# Patient Record
Sex: Female | Born: 1985
Health system: Southern US, Community
[De-identification: ages and names within clinical notes are randomized; demographics above are authoritative.]

## PROBLEM LIST (undated history)

## (undated) DIAGNOSIS — T8859XA Other complications of anesthesia, initial encounter: Secondary | ICD-10-CM

## (undated) DIAGNOSIS — Z8759 Personal history of other complications of pregnancy, childbirth and the puerperium: Secondary | ICD-10-CM

## (undated) DIAGNOSIS — T4145XA Adverse effect of unspecified anesthetic, initial encounter: Secondary | ICD-10-CM

---

## 2004-04-20 ENCOUNTER — Emergency Department: Payer: Self-pay | Admitting: Emergency Medicine

## 2006-01-16 ENCOUNTER — Ambulatory Visit: Payer: Self-pay | Admitting: Emergency Medicine

## 2006-01-16 ENCOUNTER — Emergency Department: Payer: Self-pay | Admitting: Emergency Medicine

## 2008-06-06 ENCOUNTER — Emergency Department: Payer: Self-pay | Admitting: Emergency Medicine

## 2008-09-04 ENCOUNTER — Emergency Department: Payer: Self-pay | Admitting: Emergency Medicine

## 2008-10-05 ENCOUNTER — Emergency Department: Payer: Self-pay | Admitting: Emergency Medicine

## 2008-12-16 ENCOUNTER — Emergency Department: Payer: Self-pay | Admitting: Internal Medicine

## 2011-01-07 ENCOUNTER — Emergency Department: Payer: Self-pay | Admitting: Emergency Medicine

## 2012-01-20 ENCOUNTER — Emergency Department: Payer: Self-pay | Admitting: Emergency Medicine

## 2012-01-20 LAB — URINALYSIS, COMPLETE
Bilirubin,UR: NEGATIVE
Glucose,UR: NEGATIVE mg/dL (ref 0–75)
Ketone: NEGATIVE
Ph: 6 (ref 4.5–8.0)
RBC,UR: 2 /HPF (ref 0–5)
Specific Gravity: 1.02 (ref 1.003–1.030)
Squamous Epithelial: 2
WBC UR: 372 /HPF (ref 0–5)

## 2012-01-20 LAB — PREGNANCY, URINE: Pregnancy Test, Urine: NEGATIVE m[IU]/mL

## 2012-02-01 ENCOUNTER — Emergency Department: Payer: Self-pay | Admitting: Emergency Medicine

## 2012-02-01 LAB — URINALYSIS, COMPLETE
Bilirubin,UR: NEGATIVE
Ketone: NEGATIVE
Leukocyte Esterase: NEGATIVE
Ph: 7 (ref 4.5–8.0)
RBC,UR: <1 /HPF (ref 0–5)
Squamous Epithelial: <1
WBC UR: <1 /HPF (ref 0–5)

## 2012-02-01 LAB — CBC
HCT: 35.6 % (ref 35.0–47.0)
MCH: 27.1 pg (ref 26.0–34.0)
MCV: 82 fL (ref 80–100)
Platelet: 340 10*3/uL (ref 150–440)
RBC: 4.37 10*6/uL (ref 3.80–5.20)
RDW: 14.5 % (ref 11.5–14.5)
WBC: 8 10*3/uL (ref 3.6–11.0)

## 2012-02-01 LAB — HCG, QUANTITATIVE, PREGNANCY: Beta Hcg, Quant.: <1 m[IU]/mL — ABNORMAL LOW

## 2012-02-01 LAB — WET PREP, GENITAL

## 2012-06-24 HISTORY — PX: CHOLECYSTECTOMY: SHX55

## 2012-11-25 LAB — COMPREHENSIVE METABOLIC PANEL
Alkaline Phosphatase: 90 U/L (ref 50–136)
Anion Gap: 9 (ref 7–16)
Chloride: 104 mmol/L (ref 98–107)
Co2: 22 mmol/L (ref 21–32)
Creatinine: 0.79 mg/dL (ref 0.60–1.30)
EGFR (African American): >60
EGFR (Non-African Amer.): >60
Potassium: 3.9 mmol/L (ref 3.5–5.1)
SGOT(AST): 17 U/L (ref 15–37)
SGPT (ALT): 16 U/L (ref 12–78)

## 2012-11-25 LAB — URINALYSIS, COMPLETE
Blood: NEGATIVE
Glucose,UR: NEGATIVE mg/dL (ref 0–75)
Leukocyte Esterase: NEGATIVE
Nitrite: NEGATIVE
Protein: NEGATIVE
RBC,UR: <1 /HPF (ref 0–5)
Squamous Epithelial: 2
WBC UR: 1 /HPF (ref 0–5)

## 2012-11-25 LAB — CBC
HGB: 12 g/dL (ref 12.0–16.0)
MCH: 25.9 pg — ABNORMAL LOW (ref 26.0–34.0)
Platelet: 322 10*3/uL (ref 150–440)
WBC: 20.7 10*3/uL — ABNORMAL HIGH (ref 3.6–11.0)

## 2012-11-26 ENCOUNTER — Inpatient Hospital Stay: Payer: Self-pay | Admitting: Surgery

## 2012-11-26 LAB — DRUG SCREEN, URINE
Amphetamines, Ur Screen: NEGATIVE (ref ?–1000)
Barbiturates, Ur Screen: NEGATIVE (ref ?–200)
Benzodiazepine, Ur Scrn: NEGATIVE (ref ?–200)
Cannabinoid 50 Ng, Ur ~~LOC~~: POSITIVE (ref ?–50)
Cocaine Metabolite,Ur ~~LOC~~: NEGATIVE (ref ?–300)
MDMA (Ecstasy)Ur Screen: NEGATIVE (ref ?–500)
Methadone, Ur Screen: NEGATIVE (ref ?–300)
Phencyclidine (PCP) Ur S: NEGATIVE (ref ?–25)
Tricyclic, Ur Screen: NEGATIVE (ref ?–1000)

## 2012-11-30 LAB — PATHOLOGY REPORT

## 2012-12-03 ENCOUNTER — Emergency Department: Payer: Self-pay | Admitting: Internal Medicine

## 2012-12-03 LAB — COMPREHENSIVE METABOLIC PANEL
Alkaline Phosphatase: 104 U/L (ref 50–136)
Anion Gap: 8 (ref 7–16)
Bilirubin,Total: 1.4 mg/dL — ABNORMAL HIGH (ref 0.2–1.0)
Chloride: 100 mmol/L (ref 98–107)
Co2: 27 mmol/L (ref 21–32)
Creatinine: 0.78 mg/dL (ref 0.60–1.30)
Glucose: 95 mg/dL (ref 65–99)
Potassium: 3.3 mmol/L — ABNORMAL LOW (ref 3.5–5.1)
Total Protein: 9.6 g/dL — ABNORMAL HIGH (ref 6.4–8.2)

## 2012-12-03 LAB — URINALYSIS, COMPLETE
Blood: NEGATIVE
Glucose,UR: NEGATIVE mg/dL (ref 0–75)
Nitrite: NEGATIVE
Ph: 6 (ref 4.5–8.0)
Protein: 100
RBC,UR: 3 /HPF (ref 0–5)
Squamous Epithelial: <1
WBC UR: 4 /HPF (ref 0–5)

## 2012-12-03 LAB — CBC
HCT: 38.1 % (ref 35.0–47.0)
HGB: 12.3 g/dL (ref 12.0–16.0)
MCHC: 32.4 g/dL (ref 32.0–36.0)
MCV: 78 fL — ABNORMAL LOW (ref 80–100)
RBC: 4.86 10*6/uL (ref 3.80–5.20)
WBC: 16.2 10*3/uL — ABNORMAL HIGH (ref 3.6–11.0)

## 2012-12-03 LAB — LIPASE, BLOOD: Lipase: 60 U/L — ABNORMAL LOW (ref 73–393)

## 2012-12-06 LAB — BETA STREP CULTURE(ARMC)

## 2013-04-22 ENCOUNTER — Encounter: Payer: Self-pay | Admitting: Obstetrics and Gynecology

## 2013-04-24 ENCOUNTER — Encounter: Payer: Self-pay | Admitting: Obstetrics and Gynecology

## 2013-05-27 ENCOUNTER — Encounter: Payer: Self-pay | Admitting: Obstetrics and Gynecology

## 2013-06-10 ENCOUNTER — Observation Stay: Payer: Self-pay | Admitting: Obstetrics and Gynecology

## 2013-06-10 ENCOUNTER — Emergency Department: Payer: Self-pay | Admitting: Emergency Medicine

## 2013-06-10 LAB — CBC WITH DIFFERENTIAL/PLATELET
Basophil #: 0 10*3/uL (ref 0.0–0.1)
Basophil %: 0.3 %
Eosinophil %: 0.1 %
HGB: 11.6 g/dL — ABNORMAL LOW (ref 12.0–16.0)
Lymphocyte %: 4.3 %
MCH: 26.9 pg (ref 26.0–34.0)
MCHC: 32 g/dL (ref 32.0–36.0)
MCV: 84 fL (ref 80–100)
Monocyte %: 8.9 %
Neutrophil %: 86.4 %
Platelet: 238 10*3/uL (ref 150–440)
WBC: 12.2 10*3/uL — ABNORMAL HIGH (ref 3.6–11.0)

## 2013-06-10 LAB — HEPATIC FUNCTION PANEL A (ARMC)
Albumin: 2.8 g/dL — ABNORMAL LOW (ref 3.4–5.0)
Alkaline Phosphatase: 90 U/L
Bilirubin, Direct: 0.2 mg/dL (ref 0.00–0.20)
Bilirubin,Total: 1 mg/dL (ref 0.2–1.0)
SGPT (ALT): 23 U/L (ref 12–78)

## 2013-06-10 LAB — BASIC METABOLIC PANEL
Anion Gap: 9 (ref 7–16)
BUN: 5 mg/dL — ABNORMAL LOW (ref 7–18)
Calcium, Total: 8.9 mg/dL (ref 8.5–10.1)
Chloride: 102 mmol/L (ref 98–107)
Co2: 21 mmol/L (ref 21–32)
Creatinine: 0.53 mg/dL — ABNORMAL LOW (ref 0.60–1.30)
Glucose: 84 mg/dL (ref 65–99)
Osmolality: 261 (ref 275–301)

## 2013-06-10 LAB — URINALYSIS, COMPLETE
Bacteria: NONE SEEN
Blood: NEGATIVE
Glucose,UR: NEGATIVE mg/dL (ref 0–75)
Nitrite: NEGATIVE
Ph: 6 (ref 4.5–8.0)
RBC,UR: 1 /HPF (ref 0–5)
Specific Gravity: 1.026 (ref 1.003–1.030)
WBC UR: <1 /HPF (ref 0–5)

## 2013-06-10 LAB — HCG, QUANTITATIVE, PREGNANCY: Beta Hcg, Quant.: 30239 m[IU]/mL — ABNORMAL HIGH

## 2013-06-10 LAB — RAPID INFLUENZA A&B ANTIGENS

## 2013-06-12 LAB — URINE CULTURE

## 2013-08-05 ENCOUNTER — Encounter: Payer: Self-pay | Admitting: Maternal & Fetal Medicine

## 2013-09-23 ENCOUNTER — Observation Stay: Payer: Self-pay

## 2013-09-23 LAB — CBC WITH DIFFERENTIAL/PLATELET
Basophil #: 0 10*3/uL (ref 0.0–0.1)
Basophil %: 0.4 %
Eosinophil #: 0 10*3/uL (ref 0.0–0.7)
Eosinophil %: 0 %
HCT: 29.7 % — ABNORMAL LOW (ref 35.0–47.0)
HGB: 9.6 g/dL — ABNORMAL LOW (ref 12.0–16.0)
Lymphocyte #: 0.5 10*3/uL — ABNORMAL LOW (ref 1.0–3.6)
Lymphocyte %: 7.2 %
MCH: 26.5 pg (ref 26.0–34.0)
MCHC: 32.5 g/dL (ref 32.0–36.0)
MCV: 81 fL (ref 80–100)
Monocyte #: 1.1 x10 3/mm — ABNORMAL HIGH (ref 0.2–0.9)
Monocyte %: 15 %
NEUTROS ABS: 5.9 10*3/uL (ref 1.4–6.5)
Neutrophil %: 77.4 %
PLATELETS: 165 10*3/uL (ref 150–440)
RBC: 3.64 10*6/uL — AB (ref 3.80–5.20)
RDW: 13.8 % (ref 11.5–14.5)
WBC: 7.6 10*3/uL (ref 3.6–11.0)

## 2013-09-23 LAB — COMPREHENSIVE METABOLIC PANEL
ALBUMIN: 2.3 g/dL — AB (ref 3.4–5.0)
ALK PHOS: 129 U/L — AB
AST: 14 U/L — AB (ref 15–37)
Anion Gap: 9 (ref 7–16)
BILIRUBIN TOTAL: 1.6 mg/dL — AB (ref 0.2–1.0)
BUN: 5 mg/dL — AB (ref 7–18)
CREATININE: 0.64 mg/dL (ref 0.60–1.30)
Calcium, Total: 8.4 mg/dL — ABNORMAL LOW (ref 8.5–10.1)
Chloride: 103 mmol/L (ref 98–107)
Co2: 22 mmol/L (ref 21–32)
EGFR (African American): >60
EGFR (Non-African Amer.): >60
GLUCOSE: 85 mg/dL (ref 65–99)
Osmolality: 265 (ref 275–301)
Potassium: 3.1 mmol/L — ABNORMAL LOW (ref 3.5–5.1)
SGPT (ALT): 12 U/L (ref 12–78)
Sodium: 134 mmol/L — ABNORMAL LOW (ref 136–145)
Total Protein: 6.4 g/dL (ref 6.4–8.2)

## 2013-09-23 LAB — DRUG SCREEN, URINE
AMPHETAMINES, UR SCREEN: NEGATIVE (ref ?–1000)
BARBITURATES, UR SCREEN: NEGATIVE (ref ?–200)
BENZODIAZEPINE, UR SCRN: NEGATIVE (ref ?–200)
CANNABINOID 50 NG, UR ~~LOC~~: POSITIVE (ref ?–50)
Cocaine Metabolite,Ur ~~LOC~~: NEGATIVE (ref ?–300)
MDMA (ECSTASY) UR SCREEN: NEGATIVE (ref ?–500)
Methadone, Ur Screen: NEGATIVE (ref ?–300)
OPIATE, UR SCREEN: NEGATIVE (ref ?–300)
PHENCYCLIDINE (PCP) UR S: NEGATIVE (ref ?–25)
Tricyclic, Ur Screen: NEGATIVE (ref ?–1000)

## 2013-09-23 LAB — URINALYSIS, COMPLETE
BILIRUBIN, UR: NEGATIVE
Blood: NEGATIVE
GLUCOSE, UR: NEGATIVE mg/dL (ref 0–75)
Hyaline Cast: 4
LEUKOCYTE ESTERASE: NEGATIVE
Nitrite: NEGATIVE
PH: 5 (ref 4.5–8.0)
RBC,UR: 2 /HPF (ref 0–5)
Specific Gravity: 1.031 (ref 1.003–1.030)

## 2013-09-23 LAB — MAGNESIUM: Magnesium: 1.6 mg/dL — ABNORMAL LOW

## 2013-09-27 ENCOUNTER — Encounter: Payer: Self-pay | Admitting: Maternal and Fetal Medicine

## 2014-10-14 NOTE — H&P (Signed)
Subjective/Chief Complaint RUQ pain and vomiting.   History of Present Illness 29 y/o female presents to the ER with a 3 day history of persistent epigastric and RUQ pain exacerbated by eating associated with several episodes of emesis.  Pain has been constant and radiates into her back,  Strong family hx of cholecystitis.  WBC elevated, RUQ US demonstrates stones in GB.  No ductal dilation.  Seen this am and is very groggy following morphine administration.   Past History THC usage G3P3   Past Med/Surgical Hx:  Pre-eclampsia:   Denies medical history:   denies:   ALLERGIES:  Shellfish: Swelling  Family and Social History:  Social History negative tobacco, negative ETOH, positive Illicit drugs   Place of Living Home   Review of Systems:  Subjective/Chief Complaint see above   Abdominal Pain Yes   Nausea/Vomiting Yes   Tolerating Diet No  Nauseated  Vomiting   Medications/Allergies Reviewed Medications/Allergies reviewed   Physical Exam:  GEN no acute distress, obese, disheveled   HEENT pale conjunctivae, PERRL, hearing intact to voice   RESP normal resp effort  clear BS   CARD regular rate   ABD positive tenderness  no liver/spleen enlargement  no hernia  soft  normal BS  no Adominal Mass   LYMPH negative neck   EXTR negative cyanosis/clubbing   SKIN normal to palpation, No rashes, No ulcers   NEURO cranial nerves intact   PSYCH A+O to time, place, person, sedated   Lab Results: Hepatic:  04-Jun-14 20:20   Bilirubin, Total  1.3  Alkaline Phosphatase 90  SGPT (ALT) 16  SGOT (AST) 17  Total Protein, Serum 7.8  Albumin, Serum 3.6  Routine Chem:  04-Jun-14 20:20   Glucose, Serum 88  BUN 7  Creatinine (comp) 0.79  Sodium, Serum  135  Potassium, Serum 3.9  Chloride, Serum 104  CO2, Serum 22  Calcium (Total), Serum 9.5  Osmolality (calc) 267  eGFR (African American) >60  eGFR (Non-African American) >60 (eGFR values <65m/min/1.73 m2 may be an  indication of chronic kidney disease (CKD). Calculated eGFR is useful in patients with stable renal function. The eGFR calculation will not be reliable in acutely ill patients when serum creatinine is changing rapidly. It is not useful in  patients on dialysis. The eGFR calculation may not be applicable to patients at the low and high extremes of body sizes, pregnant women, and vegetarians.)  Anion Gap 9  Lipase  50 (Result(s) reported on 25 Nov 2012 at 08:43PM.)  Routine UA:  04-Jun-14 20:20   Color (UA) Yellow  Clarity (UA) Clear  Glucose (UA) Negative  Bilirubin (UA) Negative  Ketones (UA) Negative  Specific Gravity (UA) 1.016  Blood (UA) Negative  pH (UA) 9.0  Protein (UA) Negative  Nitrite (UA) Negative  Leukocyte Esterase (UA) Negative (Result(s) reported on 25 Nov 2012 at 08:48PM.)  RBC (UA) <1 /HPF  WBC (UA) 1 /HPF  Bacteria (UA) NONE SEEN  Epithelial Cells (UA) 2 /HPF (Result(s) reported on 25 Nov 2012 at 08:48PM.)  Routine Hem:  04-Jun-14 20:20   WBC (CBC)  20.7  RBC (CBC) 4.64  Hemoglobin (CBC) 12.0  Hematocrit (CBC) 36.8  Platelet Count (CBC) 322 (Result(s) reported on 25 Nov 2012 at 08:37PM.)  MCV  79  MCH  25.9  MCHC 32.6  RDW 14.5    Assessment/Admission Diagnosis 29y/o female with acute calculus cholecystitis, suspect hydrops possible early ischemic changes given WBC elevation. History of THC usage.   Plan  admit, hydrate, unasyn, will discuss with Dr. Leanora Cover lap CCY. He will need to return to obtain consent when she is more coherent with exam/discussion.   Mother present and I did speak with her.   Electronic Signatures: Sherri Rad (MD)  (Signed 05-Jun-14 06:05)  Authored: CHIEF COMPLAINT and HISTORY, PAST MEDICAL/SURGIAL HISTORY, ALLERGIES, FAMILY AND SOCIAL HISTORY, REVIEW OF SYSTEMS, PHYSICAL EXAM, LABS, ASSESSMENT AND PLAN   Last Updated: 05-Jun-14 06:05 by Sherri Rad (MD)

## 2014-10-14 NOTE — Op Note (Signed)
PATIENT NAME:  Theresa Alvarez, Theresa Alvarez MR#:  [id number] DATE OF BIRTH:  22-Nov-1985  DATE OF PROCEDURE:  11/26/2012  PREOPERATIVE DIAGNOSIS: Acute cholecystitis.   POSTOPERATIVE DIAGNOSIS: Cholelithiasis.   PROCEDURE PERFORMED: Laparoscopic cholecystectomy.   SURGEON: Raynald KempMark A Jaymond Waage, MD ASSISTANT: Surgical scrub technologist.   TYPE OF ANESTHESIA: General endotracheal, [facility].   FINDINGS: Distended gallbladder.   SPECIMENS: Gallbladder with contents to pathology.   ESTIMATED BLOOD LOSS: 25 mL   DRAINS: None.   LAP AND NEEDLE COUNT: Correct x 2.   DESCRIPTION OF PROCEDURE: With the patient in the supine position and general endotracheal anesthesia, left arm was padded and tucked at her side. The abdomen was widely prepped and draped utilizing ChloraPrep solution. Timeout was observed. A 12 mm blunt Hassan trocar was placed through an infraumbilical transverse oriented skin incision with stay sutures being passed through the fascia. Pneumoperitoneum was established. A 5 mm Bladeless trocar was placed in the epigastrium, two 5-mm ports in the right subcostal margin. The gallbladder was grasped along the fundus and elevated towards the right shoulder superiorly and laterally. Lateral traction was applied on Hartman pouch. The hepatoduodenal ligament was then incised utilizing blunt technique, liberating a cystic duct and cystic artery with a critical view of safety cholecystectomy being performed. The cystic duct was triply clipped on the portal side, singly clipped on the gallbladder side and divided. Cystic artery was doubly clipped on the portal side, singly clipped on the gallbladder side and divided. Further dissection along the gallbladder fossa demonstrated no evidence of aberrant vessel or bile duct. The gallbladder was then retrieved off the gallbladder fossa utilizing hook cautery apparatus, captured in an Endo Catch device with spillage of a small amount of thick bile,  which was immediately aspirated and irrigated with a liter of normal saline. A 5 mm camera was used in the epigastric port to visualize extraction. The umbilical port demonstrated no evidence of bowel injury. Pneumoperitoneum was then re-established, the right upper quadrant being inspected for hemostasis. Ports were then removed under direct visualization. The infraumbilical fascial defect being reapproximated with a figure-of-eight #0 Vicryl suture in vertical orientation. A total of 30 mL of 0.25% plain Marcaine was infiltrated along all skin and fascial incisions prior to closure. A combination of 4-0 Vicryl subcuticular and 4-0 nylon simple and vertical mattress used to reapproximate skin edges followed by application of Steri-Strips, Telfa, and Tegaderm. The patient was then subsequently extubated and taken to the recovery room in stable and satisfactory condition by anesthesia services.     ____________________________ Redge GainerMark A. Egbert GaribaldiBird, MD FACS mab:cc D: 11/26/2012 20:55:49 ET T: 11/26/2012 23:33:49 ET JOB#: [id number]  cc: Loraine LericheMark A. Egbert GaribaldiBird, MD, <Dictator> Raynald KempMARK A Maksymilian Mabey MD ELECTRONICALLY SIGNED 11/27/2012 16:59

## 2014-11-01 NOTE — H&P (Signed)
L&D Evaluation:  History Expanded:  HPI 29yo At 4920w6days who presensts with severe n/v and who has been vomiting since this am with the nausea nd the mucus with the headache that has been horribkle for the two days. she is coughing and she has ahd strep in May but did not finish her meds then as she can not take pills, she is warm to the touch and is sweaty and she has a 102.5 fever,she has taken nothing and nithing makes it better she is tired and weak.her son was sick this week with a viral infection and he was seen and strep was neg   Gravida 4   Term 3   PreTerm 0   Abortion 0   Living 3   Blood Type (Maternal) O positive   Group B Strep Results Maternal (Result >5wks must be treated as unknown) unknown/result > 5 weeks ago    Maternal HIV Unknown   Maternal Syphilis Ab Unknown   Maternal Varicella Unknown   Rubella Results (Maternal) unknown   Maternal T-Dap Unknown   [facility]   Presents with nausea/vomiting   Patient's Medical History No Chronic Illness    Patient's Surgical History Colecystectomy    Medications Pre Natal Vitamins    Allergies NKDA   Social History none    Family History Non-Contributory    ROS:  General very weak   HEENT sore throat and cough and   CNS normal   Resp cough   CV normal   Renal normal   MS normal   Exam:  Vital Signs stable  TM 102.5    Urine Protein 1+   General no apparent distress   Mental Status clear    Chest clear    Heart normal sinus rhythm   Abdomen gravid, non-tender   Estimated Fetal Weight Small for gestational age   Fundal Height umb   Back no CVAT   Edema no edema    Reflexes 1+    Clonus positive   Pelvic no external lesions, cervix closed and thick   Mebranes Intact   FHT normal rate with no decels   Ucx absent   Skin dry   Lymph no lymphadenopathy    Impression:  Impression viral vs strep throat and flu   Plan:  Plan UA   Comments monitor throat  and get rapid strep and rapid flu will treast with metrogel and follow up next week so she cna have some relief this weekend and get totER to be tested for others   Follow Up Appointment need to schedule. MONDAY   Electronic Signatures: Adria DevonKlett, Joshia Kitchings (MD)  (Signed 18-Dec-14 21:16)  Authored: L&D Evaluation   Last Updated: 18-Dec-14 21:16 by Adria DevonKlett, Nakota Elsen (MD)

## 2014-11-01 NOTE — H&P (Signed)
L&D Evaluation:  History Expanded:  HPI 29yo [id number] at 8958w6days by LMP consistent with 13 week ultrasound.  Pregnancy complicated by a history of preeclampsia with G1. She presents with multiple vague comlaints for the past two days.  She states that she received a TDaP [facility].  Sinice then she has had pain "all over."  She states that she has three children and "can't get up to feed them."  She notes a headache mainly in the frontal region of her head. She has had no fevers, chills, or visual disturbances.  The headache started this morning after she had an episode of emesis.  She denies trouble breathing, chest pain, abdominal pain (the only place she does not have pain).  She has no nausea. However, she states that she has been unable to hold down anything (liquid or solid) for the past two days. She has had a cough which is occasional productive for mucus.  She has had recent diarrhea going 3 - 4 times in the past few days.  She denies urinary and vaginal symptoms.  She notes a clear vaginal discharge for the past week.  She notes positive fetal movement, no gush of fluid, no vaginal bleeding, and no contractions.   Gravida 4   Term 3   PreTerm 0   Abortion 0   Living 3   Blood Type (Maternal) O positive   Group B Strep Results Maternal (Result >5wks must be treated as unknown) unknown/result > 5 weeks ago   Maternal HIV Unknown   Maternal Syphilis Ab Unknown   Maternal Varicella Unknown   Rubella Results (Maternal) unknown   Maternal T-Dap Unknown   [facility]   Patient's Medical History No Chronic Illness   Patient's Surgical History Colecystectomy   Medications Pre Natal Vitamins  Iron   Allergies NKDA   Social History drugs  marijuana (most recent one month ago)   Family History Non-Contributory   ROS:  General very weak   HEENT headache   CNS normal   GI diarrhea   Resp cough   CV normal   Renal normal   MS  normal   Exam:  Vital Signs stable  T98.9, P110, BP 99/53, O2 sats 98%RA   Urine Protein 1+   General no apparent distress, appears tired   Mental Status clear   Chest clear   Heart normal sinus rhythm, tachycardia   Abdomen gravid, non-tender   Estimated Fetal Weight Small for gestational age   Back no CVAT   Edema no edema   Clonus negative   Mebranes Intact   FHT normal rate with no decels   FHT Description 150/mod var/+accles/no decels   Ucx irregular, infrequent   Skin dry   Lymph no lymphadenopathy   Impression:  Impression 1) Intrauterine pregnancy at 4058w6d gestational age, 2) inability to keep food down, 3) generalized pain and headache   Plan:  Comments - UA and urine drug screen - IV fluid rescusitation (start with 1 liter NS) - CBC w/diff, CMP - tylenol for headache - no evidence of preeclampsia - Fetal well being reassuring with cat 1 tracing.  Continue to watch   Electronic Signatures: Conard NovakJackson, Meredyth Hornung D (MD)  (Signed 02-Apr-15 18:46)  Authored: L&D Evaluation   Last Updated: 02-Apr-15 18:46 by Conard NovakJackson, Milayah Krell D (MD)

## 2014-12-28 ENCOUNTER — Emergency Department
Admission: EM | Admit: 2014-12-28 | Discharge: 2014-12-28 | Disposition: A | Discharge status: Home / Self Care | Payer: Medicaid Other | Attending: Emergency Medicine | Admitting: Emergency Medicine

## 2014-12-28 ENCOUNTER — Encounter: Payer: Self-pay | Admitting: Emergency Medicine

## 2014-12-28 DIAGNOSIS — M62838 Other muscle spasm: Secondary | ICD-10-CM | POA: Diagnosis not present

## 2014-12-28 DIAGNOSIS — N898 Other specified noninflammatory disorders of vagina: Secondary | ICD-10-CM | POA: Insufficient documentation

## 2014-12-28 DIAGNOSIS — M62831 Muscle spasm of calf: Secondary | ICD-10-CM | POA: Diagnosis not present

## 2014-12-28 DIAGNOSIS — M79604 Pain in right leg: Secondary | ICD-10-CM | POA: Diagnosis present

## 2014-12-28 DIAGNOSIS — R300 Dysuria: Secondary | ICD-10-CM | POA: Insufficient documentation

## 2014-12-28 DIAGNOSIS — Z3202 Encounter for pregnancy test, result negative: Secondary | ICD-10-CM | POA: Diagnosis not present

## 2014-12-28 LAB — URINALYSIS COMPLETE WITH MICROSCOPIC (ARMC ONLY)
Bilirubin Urine: NEGATIVE
GLUCOSE, UA: NEGATIVE mg/dL
Ketones, ur: NEGATIVE mg/dL
Nitrite: NEGATIVE
Protein, ur: NEGATIVE mg/dL
Specific Gravity, Urine: 1.02 (ref 1.005–1.030)
pH: 6 (ref 5.0–8.0)

## 2014-12-28 LAB — CBC WITH DIFFERENTIAL/PLATELET
BASOS ABS: 0.1 10*3/uL (ref 0–0.1)
Basophils Relative: 1 %
Eosinophils Absolute: 0.1 10*3/uL (ref 0–0.7)
Eosinophils Relative: 1 %
HCT: 36.7 % (ref 35.0–47.0)
Hemoglobin: 11.9 g/dL — ABNORMAL LOW (ref 12.0–16.0)
Lymphocytes Relative: 29 %
Lymphs Abs: 2.8 10*3/uL (ref 1.0–3.6)
MCH: 27.2 pg (ref 26.0–34.0)
MCHC: 32.5 g/dL (ref 32.0–36.0)
MCV: 83.7 fL (ref 80.0–100.0)
Monocytes Absolute: 0.6 10*3/uL (ref 0.2–0.9)
Monocytes Relative: 6 %
NEUTROS ABS: 6.2 10*3/uL (ref 1.4–6.5)
Neutrophils Relative %: 63 %
PLATELETS: 276 10*3/uL (ref 150–440)
RBC: 4.38 MIL/uL (ref 3.80–5.20)
RDW: 13.5 % (ref 11.5–14.5)
WBC: 9.9 10*3/uL (ref 3.6–11.0)

## 2014-12-28 LAB — BASIC METABOLIC PANEL
Anion gap: 6 (ref 5–15)
BUN: 8 mg/dL (ref 6–20)
CHLORIDE: 106 mmol/L (ref 101–111)
CO2: 26 mmol/L (ref 22–32)
Calcium: 8.6 mg/dL — ABNORMAL LOW (ref 8.9–10.3)
Creatinine, Ser: 0.69 mg/dL (ref 0.44–1.00)
GFR calc non Af Amer: >60 mL/min (ref >60)
Glucose, Bld: 99 mg/dL (ref 65–99)
Potassium: 3.6 mmol/L (ref 3.5–5.1)
SODIUM: 138 mmol/L (ref 135–145)

## 2014-12-28 LAB — POCT PREGNANCY, URINE: Preg Test, Ur: NEGATIVE

## 2014-12-28 MED ORDER — BACITRACIN ZINC 500 UNIT/GM EX OINT
TOPICAL_OINTMENT | CUTANEOUS | Status: AC
  Filled 2014-12-28: qty 0.9

## 2014-12-28 NOTE — ED Notes (Signed)
States she has had intermittent jerking to body ..jerking episodes last for a short while. The jerking may be in different parts of body ..denies any fever,n/v/d or any health problems.. No jerking noted on arrival to ed

## 2014-12-28 NOTE — ED Provider Notes (Signed)
[facility] Provider Note  ____________________________________________  Time seen: Approximately 8:25 AM  I have reviewed the triage vital signs and the nursing notes.   HISTORY  Chief Complaint No chief complaint on file.    HPI Theresa Alvarez is a 29 y.o. female patient complaining of intermittent jerking to the body of the upper and lower extremities. Patient stated there is no pattern to this and it occurs sporadically. Patient's concern because she stated what she was driving her right arm jerked and she momentarily lost control of the car. Patient had a previous episode 2 days prior to her left leg jerked while she was at work. Patient is also complaining of a laceration to the labia majora secondary to a shaving incident 3 days ago. States pain when urinating.    History reviewed. No pertinent past medical history.  There are no active problems to display for this patient.   Past Surgical History  Procedure Laterality Date  . Cholecystectomy      No current outpatient prescriptions on file.  Allergies Review of patient's allergies indicates no known allergies.  No family history on file.  Social History History  Substance Use Topics  . Smoking status: Never Smoker   . Smokeless tobacco: Not on file  . Alcohol Use: No    Review of Systems Constitutional: No fever/chills Eyes: No visual changes. ENT: No sore throat. Cardiovascular: Denies chest pain. Respiratory: Denies shortness of breath. Gastrointestinal: No abdominal pain.  No nausea, no vomiting.  No diarrhea.  No constipation. Genitourinary: Positive for dysuria. Musculoskeletal: Negative for back pain. Skin: Negative for rash. Vaginal labia irritation Neurological: Negative for headaches, focal weakness or numbness. States sporadic extremity jerking.  10-point ROS otherwise negative.  ____________________________________________   PHYSICAL  EXAM:  VITAL SIGNS: ED Triage Vitals  Enc Vitals Group     BP 12/28/14 0822 140/82 mmHg     Pulse Rate 12/28/14 0822 95     Resp 12/28/14 0822 20     Temp 12/28/14 0822 97.9 F (36.6 C)     Temp Source 12/28/14 0822 Oral     SpO2 12/28/14 0822 99 %     Weight 12/28/14 0822 158 lb (71.668 kg)     Height 12/28/14 0822 5\' 1"  (1.549 m)     Head Cir --      Peak Flow --      Pain Score --      Pain Loc --      Pain Edu? --      Excl. in GC? --     Constitutional: Alert and oriented. Well appearing and in no acute distress. Eyes: Conjunctivae are normal. PERRL. EOMI. Head: Atraumatic. Nose: No congestion/rhinnorhea. Mouth/Throat: Mucous membranes are moist.  Oropharynx non-erythematous. Neck: No stridor. No cervical spine tenderness to palpation. Hematological/Lymphatic/Immunilogical: No cervical lymphadenopathy. Cardiovascular: Normal rate, regular rhythm. Grossly normal heart sounds.  Good peripheral circulation. Respiratory: Normal respiratory effort.  No retractions. Lungs CTAB. Gastrointestinal: Soft and nontender. No distention. No abdominal bruits. No CVA tenderness. Genitourinary:  Musculoskeletal: No lower extremity tenderness nor edema.  No joint effusions. Neurologic:  Normal speech and language. No gross focal neurologic deficits are appreciated. Speech is normal. No gait instability. Skin:  Skin is warm, dry and intact. No rash noted. Mild erythema secondary to shaving in the vaginal area. Psychiatric: Mood and affect are normal. Speech and behavior are normal.  ____________________________________________   LABS (all labs ordered are listed, but only abnormal results  are displayed)  Labs Reviewed  CBC WITH DIFFERENTIAL/PLATELET - Abnormal; Notable for the following:    Hemoglobin 11.9 (*)    All other components within normal limits  BASIC METABOLIC PANEL - Abnormal; Notable for the following:    Calcium 8.6 (*)    All other components within normal limits   URINALYSIS COMPLETEWITH MICROSCOPIC ([facility] ONLY) - Abnormal; Notable for the following:    Color, Urine YELLOW (*)    APPearance HAZY (*)    Hgb urine dipstick 1+ (*)    Leukocytes, UA 1+ (*)    Bacteria, UA RARE (*)    Squamous Epithelial / LPF 6-30 (*)    All other components within normal limits  POC URINE PREG, ED  POCT PREGNANCY, URINE   ____________________________________________  EKG   ____________________________________________  RADIOLOGY   ____________________________________________   PROCEDURES  Procedure(s) performed: None  Critical Care performed: No  ____________________________________________   INITIAL IMPRESSION / ASSESSMENT AND PLAN / ED COURSE  Pertinent labs & imaging results that were available during my care of the patient were reviewed by me and considered in my medical decision making (see chart for details).  Intermittent muscle spasms etiology unknown. Patient advised follow-up for neurology. Patient advised to determine if condition worsens. ____________________________________________   FINAL CLINICAL IMPRESSION(S) / ED DIAGNOSES  Final diagnoses:  Muscle spasm      Joni Reining, PA-C 12/28/14 [id number]  Sharman Cheek, MD 12/28/14 1520

## 2014-12-28 NOTE — ED Notes (Signed)
Pt reports  History of tendontitis. States her arms "jerk" at random.

## 2015-01-24 ENCOUNTER — Encounter: Payer: Self-pay | Admitting: *Deleted

## 2015-01-25 ENCOUNTER — Ambulatory Visit: Payer: Medicaid Other | Admitting: Diagnostic Neuroimaging

## 2015-01-26 ENCOUNTER — Encounter: Payer: Self-pay | Admitting: Diagnostic Neuroimaging

## 2015-03-17 ENCOUNTER — Emergency Department: Payer: Medicaid Other

## 2015-03-17 ENCOUNTER — Encounter: Payer: Self-pay | Admitting: Emergency Medicine

## 2015-03-17 ENCOUNTER — Emergency Department
Admission: EM | Admit: 2015-03-17 | Discharge: 2015-03-17 | Disposition: A | Discharge status: Home / Self Care | Payer: Medicaid Other | Attending: Emergency Medicine | Admitting: Emergency Medicine

## 2015-03-17 DIAGNOSIS — Z3A08 8 weeks gestation of pregnancy: Secondary | ICD-10-CM | POA: Diagnosis not present

## 2015-03-17 DIAGNOSIS — O200 Threatened abortion: Secondary | ICD-10-CM | POA: Diagnosis not present

## 2015-03-17 DIAGNOSIS — O209 Hemorrhage in early pregnancy, unspecified: Secondary | ICD-10-CM | POA: Diagnosis present

## 2015-03-17 LAB — CBC
HCT: 33.9 % — ABNORMAL LOW (ref 35.0–47.0)
Hemoglobin: 11.1 g/dL — ABNORMAL LOW (ref 12.0–16.0)
MCH: 27.3 pg (ref 26.0–34.0)
MCHC: 32.7 g/dL (ref 32.0–36.0)
MCV: 83.5 fL (ref 80.0–100.0)
Platelets: 229 10*3/uL (ref 150–440)
RBC: 4.06 MIL/uL (ref 3.80–5.20)
RDW: 14.7 % — ABNORMAL HIGH (ref 11.5–14.5)
WBC: 8.7 10*3/uL (ref 3.6–11.0)

## 2015-03-17 LAB — BASIC METABOLIC PANEL
Anion gap: 4 — ABNORMAL LOW (ref 5–15)
BUN: 11 mg/dL (ref 6–20)
CHLORIDE: 109 mmol/L (ref 101–111)
CO2: 24 mmol/L (ref 22–32)
CREATININE: 0.57 mg/dL (ref 0.44–1.00)
Calcium: 8.5 mg/dL — ABNORMAL LOW (ref 8.9–10.3)
GFR calc non Af Amer: >60 mL/min (ref >60)
Glucose, Bld: 88 mg/dL (ref 65–99)
Potassium: 3.4 mmol/L — ABNORMAL LOW (ref 3.5–5.1)
Sodium: 137 mmol/L (ref 135–145)

## 2015-03-17 LAB — HCG, QUANTITATIVE, PREGNANCY: hCG, Beta Chain, Quant, S: 6894 m[IU]/mL — ABNORMAL HIGH (ref <5)

## 2015-03-17 LAB — ABO/RH: ABO/RH(D): O POS

## 2015-03-17 NOTE — ED Notes (Signed)
Reports [redacted] wks pregnant, states she woke up this am with bleeding and cramping.

## 2015-03-17 NOTE — Discharge Instructions (Signed)
Please follow up with [facility] this week for repeat beta hCG testing. Your beta hCG level today is 6000. Return to the emergency department for any abdominal pain, significantly increased bleeding, or any other symptom personally concerning to yourself.   Threatened Miscarriage A threatened miscarriage occurs when you have vaginal bleeding during your first 20 weeks of pregnancy but the pregnancy has not ended. If you have vaginal bleeding during this time, your health care provider will do tests to make sure you are still pregnant. If the tests show you are still pregnant and the developing baby (fetus) inside your womb (uterus) is still growing, your condition is considered a threatened miscarriage. A threatened miscarriage does not mean your pregnancy will end, but it does increase the risk of losing your pregnancy (complete miscarriage). CAUSES  The cause of a threatened miscarriage is usually not known. If you go on to have a complete miscarriage, the most common cause is an abnormal number of chromosomes in the developing baby. Chromosomes are the structures inside cells that hold all your genetic material. Some causes of vaginal bleeding that do not result in miscarriage include:  Having sex.  Having an infection.  Normal hormone changes of pregnancy.  Bleeding that occurs when an egg implants in your uterus. RISK FACTORS Risk factors for bleeding in early pregnancy include:  Obesity.  Smoking.  Drinking excessive amounts of alcohol or caffeine.  Recreational drug use. SIGNS AND SYMPTOMS  Light vaginal bleeding.  Mild abdominal pain or cramps. DIAGNOSIS  If you have bleeding with or without abdominal pain before 20 weeks of pregnancy, your health care provider will do tests to check whether you are still pregnant. One important test involves using sound waves and a computer (ultrasound) to create images of the inside of your uterus. Other tests include an internal  exam of your vagina and uterus (pelvic exam) and measurement of your baby's heart rate.  You may be diagnosed with a threatened miscarriage if:  Ultrasound testing shows you are still pregnant.  Your baby's heart rate is strong.  A pelvic exam shows that the opening between your uterus and your vagina (cervix) is closed.  Your heart rate and blood pressure are stable.  Blood tests confirm you are still pregnant. TREATMENT  No treatments have been shown to prevent a threatened miscarriage from going on to a complete miscarriage. However, the right home care is important.  HOME CARE INSTRUCTIONS   Make sure you keep all your appointments for prenatal care. This is very important.  Get plenty of rest.  Do not have sex or use tampons if you have vaginal bleeding.  Do not douche.  Do not smoke or use recreational drugs.  Do not drink alcohol.  Avoid caffeine. SEEK MEDICAL CARE IF:  You have light vaginal bleeding or spotting while pregnant.  You have abdominal pain or cramping.  You have a fever. SEEK IMMEDIATE MEDICAL CARE IF:  You have heavy vaginal bleeding.  You have blood clots coming from your vagina.  You have severe low back pain or abdominal cramps.  You have fever, chills, and severe abdominal pain. MAKE SURE YOU:  Understand these instructions.  Will watch your condition.  Will get help right away if you are not doing well or get worse. Document Released: 06/10/2005 Document Revised: 06/15/2013 Document Reviewed: 04/06/2013 [facility] Patient Information 2015 [place], [place]. This information is not intended to replace advice given to you by your health care provider. Make sure you  discuss any questions you have with your health care provider. ° °

## 2015-03-17 NOTE — ED Provider Notes (Signed)
[facility] Emergency Department Provider Note  Time seen: 7:22 AM  I have reviewed the triage vital signs and the nursing notes.   HISTORY  Chief Complaint Vaginal Bleeding    HPI Theresa Alvarez is a 29 y.o. female G5 P4 with no past medical history presents the emergency department with vaginal bleeding and cramping. According to the patient she is approximately [redacted] weeks pregnant by last menstrual period, and awoke this morning to mild to moderate vaginal bleeding with lower abdominal cramping. Denies any tissue passage. States the cramping is mild currently. No other medical complaints currently.     History reviewed. No pertinent past medical history.  There are no active problems to display for this patient.   Past Surgical History  Procedure Laterality Date  . Cholecystectomy  2014    lap    No current outpatient prescriptions on file.  Allergies Review of patient's allergies indicates no known allergies.  Family History  Problem Relation Age of Onset  . Diabetes Father   . Diabetes Maternal Grandmother   . Diabetes Paternal Grandmother     Social History Social History  Substance Use Topics  . Smoking status: Never Smoker   . Smokeless tobacco: None  . Alcohol Use: No    Review of Systems Constitutional: Negative for fever. Cardiovascular: Negative for chest pain. Respiratory: Negative for shortness of breath. Gastrointestinal: Mild lower abdominal cramping Genitourinary: Negative for dysuria. Neurological: Negative for headache 10-point ROS otherwise negative.  ____________________________________________   PHYSICAL EXAM:  VITAL SIGNS: ED Triage Vitals  Enc Vitals Group     BP 03/17/15 0707 119/72 mmHg     Pulse Rate 03/17/15 0707 91     Resp 03/17/15 0707 18     Temp 03/17/15 0707 98.1 F (36.7 C)     Temp Source 03/17/15 0707 Oral     SpO2 03/17/15 0707 10 %     Weight 03/17/15 0707 162 lb (73.483 kg)   Height 03/17/15 0707  (1.549 m)     Head Cir --      Peak Flow --      Pain Score 03/17/15 0708 2     Pain Loc --      Pain Edu? --      Excl. in GC? --     Constitutional: Alert and oriented. Well appearing and in no distress. Eyes: Normal exam ENT   Mouth/Throat: Mucous membranes are moist. Cardiovascular: Normal rate, regular rhythm. Respiratory: Normal respiratory effort without tachypnea nor retractions. Breath sounds are clear and equal bilaterally. No wheezes/rales/rhonchi. Gastrointestinal: Soft and nontender. No distention.  There is no CVA tenderness. Musculoskeletal: Nontender with normal range of motion in all extremities.  Neurologic:  Normal speech and language. No gross focal neurologic deficits are appreciated. Speech is normal. Skin:  Skin is warm, dry and intact.  Psychiatric: Mood and affect are normal. Speech and behavior are normal.  ____________________________________________     RADIOLOGY  Ultrasound consistent with 5 week 5 day fetus, heart rate 93 bpm seen.  ____________________________________________   INITIAL IMPRESSION / ASSESSMENT AND PLAN / ED COURSE  Pertinent labs & imaging results that were available during my care of the patient were reviewed by me and considered in my medical decision making (see chart for details).  Patient approximately [redacted] weeks pregnant by last pressure., With vaginal bleeding and abdominal cramping. We will check labs including beta hCG, and obtain an ultrasound to help further evaluate. No distress currently. Overall the  patient appears very well.  Labs within normal limit, positive blood type. Ultrasound shows 5 week 5 day pregnancy, with heartbeat seen. I discussed the findings with the patient, she believes she could be 5 weeks instead of 8 weeks. I discussed with the patient follow up with Phineas Real for repeat beta hCG testing. Patient agreeable. Discussed my normal miscarriage return precautions, patient  agreeable.  ____________________________________________   FINAL CLINICAL IMPRESSION(S) / ED DIAGNOSES  Threatened abortion   Minna Antis, MD 03/17/15 1042

## 2015-03-17 NOTE — ED Notes (Signed)
Pt presents with vaginal bleeding starting around 6am today. Pt is [redacted] weeks pregnant and states this is her fifth pregnancy. Pt states she currently has a tampon in place and has not had to change it out. Pt also c/o mild cramping. No acute distress noted at this time.

## 2015-03-17 NOTE — ED Notes (Signed)
Pt left without discharge instructions. edp did go back in and explain discharge instructions to pt.  Pt left without signing.

## 2015-03-20 ENCOUNTER — Other Ambulatory Visit: Payer: Self-pay | Admitting: Primary Care

## 2015-03-20 DIAGNOSIS — Z3490 Encounter for supervision of normal pregnancy, unspecified, unspecified trimester: Secondary | ICD-10-CM

## 2015-03-23 ENCOUNTER — Ambulatory Visit: Payer: Medicaid Other

## 2015-04-28 ENCOUNTER — Emergency Department
Admission: EM | Admit: 2015-04-28 | Discharge: 2015-04-28 | Disposition: A | Discharge status: Home / Self Care | Payer: Medicaid Other | Attending: Emergency Medicine | Admitting: Emergency Medicine

## 2015-04-28 ENCOUNTER — Encounter: Payer: Self-pay | Admitting: *Deleted

## 2015-04-28 DIAGNOSIS — Z3A12 12 weeks gestation of pregnancy: Secondary | ICD-10-CM | POA: Diagnosis not present

## 2015-04-28 DIAGNOSIS — O210 Mild hyperemesis gravidarum: Secondary | ICD-10-CM

## 2015-04-28 LAB — COMPREHENSIVE METABOLIC PANEL
ALT: 11 U/L — ABNORMAL LOW (ref 14–54)
AST: 16 U/L (ref 15–41)
Albumin: 3.6 g/dL (ref 3.5–5.0)
Alkaline Phosphatase: 60 U/L (ref 38–126)
Anion gap: 5 (ref 5–15)
BUN: <5 mg/dL — ABNORMAL LOW (ref 6–20)
CO2: 24 mmol/L (ref 22–32)
Calcium: 8.9 mg/dL (ref 8.9–10.3)
Chloride: 105 mmol/L (ref 101–111)
Creatinine, Ser: 0.49 mg/dL (ref 0.44–1.00)
Glucose, Bld: 83 mg/dL (ref 65–99)
Potassium: 3.4 mmol/L — ABNORMAL LOW (ref 3.5–5.1)
SODIUM: 134 mmol/L — AB (ref 135–145)
Total Bilirubin: 1.5 mg/dL — ABNORMAL HIGH (ref 0.3–1.2)
Total Protein: 7.1 g/dL (ref 6.5–8.1)

## 2015-04-28 LAB — LIPASE, BLOOD: LIPASE: 15 U/L (ref 11–51)

## 2015-04-28 LAB — CBC
HCT: 35.8 % (ref 35.0–47.0)
Hemoglobin: 11.8 g/dL — ABNORMAL LOW (ref 12.0–16.0)
MCH: 28 pg (ref 26.0–34.0)
MCHC: 32.9 g/dL (ref 32.0–36.0)
MCV: 85.2 fL (ref 80.0–100.0)
Platelets: 235 10*3/uL (ref 150–440)
RBC: 4.2 MIL/uL (ref 3.80–5.20)
RDW: 13.9 % (ref 11.5–14.5)
WBC: 16 10*3/uL — AB (ref 3.6–11.0)

## 2015-04-28 LAB — URINALYSIS COMPLETE WITH MICROSCOPIC (ARMC ONLY)
BILIRUBIN URINE: NEGATIVE
Bacteria, UA: NONE SEEN
GLUCOSE, UA: NEGATIVE mg/dL
Hgb urine dipstick: NEGATIVE
Ketones, ur: NEGATIVE mg/dL
Leukocytes, UA: NEGATIVE
NITRITE: NEGATIVE
Protein, ur: NEGATIVE mg/dL
SPECIFIC GRAVITY, URINE: 1.002 — AB (ref 1.005–1.030)
pH: 8 (ref 5.0–8.0)

## 2015-04-28 MED ORDER — SODIUM CHLORIDE 0.9 % IV BOLUS (SEPSIS)
2000.0000 mL | Freq: Once | INTRAVENOUS | Status: AC
  Administered 2015-04-28: 2000 mL via INTRAVENOUS

## 2015-04-28 MED ORDER — ONDANSETRON HCL 4 MG/2ML IJ SOLN
INTRAMUSCULAR | Status: AC
  Administered 2015-04-28: 4 mg
  Filled 2015-04-28: qty 2

## 2015-04-28 NOTE — ED Notes (Signed)
Attempted to hear fetal heart tones but did not have success. Pt reports heart tones were heard two days ago at [facility].

## 2015-04-28 NOTE — ED Notes (Signed)
Pt arrived to ED reporting nausea and vomiting at least three times a day for the past 5 weeks. Pt reports recently beginning to feel weak and "dehyrated." pt reports this is her fifth pregnancy and pt reports this is not normal and feels as though "something is off"

## 2015-04-28 NOTE — ED Notes (Addendum)
Dr. Cyril LoosenKinner at bedside with ultrasound.  Per Dr. Cyril LoosenKinner, pt 12.[redacted] wks pregnant

## 2015-04-28 NOTE — ED Provider Notes (Signed)
[facility] Emergency Department Provider Note  ____________________________________________  Time seen: 5:30 PM  I have reviewed the triage vital signs and the nursing notes.   HISTORY  Chief Complaint Nausea and Emesis    HPI Amiayah L Pankonin is a 29 y.o. female who presents with complaints of nausea and vomiting essentially daily for the past 5 weeks. She reports she is [redacted] weeks pregnant and she started to feel dehydrated. She did not have this issue during her first pregnancies. She denies abdominal pain. No bleeding. No fevers no chills. No dysuria. No frequency. Her OB gave her Zofran which is not helping particularly well     History reviewed. No pertinent past medical history.  There are no active problems to display for this patient.   Past Surgical History  Procedure Laterality Date  . Cholecystectomy  2014    lap    No current outpatient prescriptions on file.  Allergies Shrimp  Family History  Problem Relation Age of Onset  . Diabetes Father   . Diabetes Maternal Grandmother   . Diabetes Paternal Grandmother     Social History Social History  Substance Use Topics  . Smoking status: Never Smoker   . Smokeless tobacco: None  . Alcohol Use: No    Review of Systems  Constitutional: Negative for fever. Eyes: Negative for visual changes. ENT: Negative for sore throat Cardiovascular: Negative for chest pain. Respiratory: Negative for shortness of breath. Gastrointestinal: Negative for abdominal pain. Positive for vomiting Genitourinary: Negative for dysuria. Musculoskeletal: Negative for back pain. Skin: Negative for rash. Neurological: Negative for headaches or focal weakness Psychiatric: No anxiety    ____________________________________________   PHYSICAL EXAM:  VITAL SIGNS: ED Triage Vitals  Enc Vitals Group     BP 04/28/15 1434 120/74 mmHg     Pulse Rate 04/28/15 1434 104     Resp 04/28/15 1434 18   Temp 04/28/15 1434 98.4 F (36.9 C)     Temp Source 04/28/15 1434 Oral     SpO2 04/28/15 1434 99 %     Weight 04/28/15 1434 153 lb (69.4 kg)     Height 04/28/15 1434  (1.549 m)     Head Cir --      Peak Flow --      Pain Score 04/28/15 1431 7     Pain Loc --      Pain Edu? --      Excl. in GC? --     Constitutional: Alert and oriented. Well appearing and in no distress. Eyes: Conjunctivae are normal.  ENT   Head: Normocephalic and atraumatic.   Mouth/Throat: Mucous membranes are moist. Cardiovascular: Normal rate, regular rhythm. Normal and symmetric distal pulses are present in all extremities. No murmurs, rubs, or gallops. Respiratory: Normal respiratory effort without tachypnea nor retractions. Breath sounds are clear and equal bilaterally.  Gastrointestinal: Soft and non-tender in all quadrants. No distention. There is no CVA tenderness. Genitourinary: deferred Musculoskeletal: Nontender with normal range of motion in all extremities. No lower extremity tenderness nor edema. Neurologic:  Normal speech and language. No gross focal neurologic deficits are appreciated. Skin:  Skin is warm, dry and intact. No rash noted. Psychiatric: Mood and affect are normal. Patient exhibits appropriate insight and judgment.  ____________________________________________    LABS (pertinent positives/negatives)  Labs Reviewed  CBC - Abnormal; Notable for the following:    WBC 16.0 (*)    Hemoglobin 11.8 (*)    All other components within normal limits  COMPREHENSIVE METABOLIC PANEL - Abnormal; Notable for the following:    Sodium 134 (*)    Potassium 3.4 (*)    BUN <5 (*)    ALT 11 (*)    Total Bilirubin 1.5 (*)    All other components within normal limits  LIPASE, BLOOD  URINALYSIS COMPLETEWITH MICROSCOPIC ([facility] ONLY)    ____________________________________________   EKG  None  ____________________________________________    RADIOLOGY I have personally  reviewed any xrays that were ordered on this patient: None  ____________________________________________   PROCEDURES  Procedure(s) performed: none  Critical Care performed: none  ____________________________________________   INITIAL IMPRESSION / ASSESSMENT AND PLAN / ED COURSE  Pertinent labs & imaging results that were available during my care of the patient were reviewed by me and considered in my medical decision making (see chart for details).  Patient with nausea and vomiting consistent with morning sickness. She is mildly tachycardic. We will give IV fluids, IV Zofran and check labs and reevaluate  ----------------------------------------- 7:52 PM on 04/28/2015 -----------------------------------------  Patient feeling significantly better. EMBU: Heart rate 158  Follow-up with her OB, return cautioned discussed ____________________________________________   FINAL CLINICAL IMPRESSION(S) / ED DIAGNOSES  Final diagnoses:  Morning sickness     Jene Everyobert Amere Bricco, MD 04/28/15 2004

## 2015-04-28 NOTE — ED Notes (Signed)
Per ED medic, pt received 2L of fluids

## 2015-04-28 NOTE — Discharge Instructions (Signed)
Hyperemesis Gravidarum  Hyperemesis gravidarum is a severe form of nausea and vomiting that happens during pregnancy. Hyperemesis is worse than morning sickness. It may cause you to have nausea or vomiting all day for many days. It may keep you from eating and drinking enough food and liquids. Hyperemesis usually occurs during the first half (the first 20 weeks) of pregnancy. It often goes away once a woman is in her second half of pregnancy. However, sometimes hyperemesis continues through an entire pregnancy.   CAUSES   The cause of this condition is not completely known but is thought to be related to changes in the body's hormones when pregnant. It could be from the high level of the pregnancy hormone or an increase in estrogen in the body.   SIGNS AND SYMPTOMS    Severe nausea and vomiting.   Nausea that does not go away.   Vomiting that does not allow you to keep any food down.   Weight loss and body fluid loss (dehydration).   Having no desire to eat or not liking food you have previously enjoyed.  DIAGNOSIS   Your health care provider will do a physical exam and ask you about your symptoms. He or she may also order blood tests and urine tests to make sure something else is not causing the problem.   TREATMENT   You may only need medicine to control the problem. If medicines do not control the nausea and vomiting, you will be treated in the hospital to prevent dehydration, increased acid in the blood (acidosis), weight loss, and changes in the electrolytes in your body that may harm the unborn baby (fetus). You may need IV fluids.   HOME CARE INSTRUCTIONS    Only take over-the-counter or prescription medicines as directed by your health care provider.   Try eating a couple of dry crackers or toast in the morning before getting out of bed.   Avoid foods and smells that upset your stomach.   Avoid fatty and spicy foods.   Eat 5-6 small meals a day.   Do not drink when eating meals. Drink between  meals.   For snacks, eat high-protein foods, such as cheese.   Eat or suck on things that have ginger in them. Ginger helps nausea.   Avoid food preparation. The smell of food can spoil your appetite.   Avoid iron pills and iron in your multivitamins until after 3-4 months of being pregnant. However, consult with your health care provider before stopping any prescribed iron pills.  SEEK MEDICAL CARE IF:    Your abdominal pain increases.   You have a severe headache.   You have vision problems.   You are losing weight.  SEEK IMMEDIATE MEDICAL CARE IF:    You are unable to keep fluids down.   You vomit blood.   You have constant nausea and vomiting.   You have excessive weakness.   You have extreme thirst.   You have dizziness or fainting.   You have a fever or persistent symptoms for more than 2-3 days.   You have a fever and your symptoms suddenly get worse.  MAKE SURE YOU:    Understand these instructions.   Will watch your condition.   Will get help right away if you are not doing well or get worse.     This information is not intended to replace advice given to you by your health care provider. Make sure you discuss any questions you have with   your health care provider.     Document Released: 06/10/2005 Document Revised: 03/31/2013 Document Reviewed: 01/20/2013  Elsevier Interactive Patient Education 2016 [employer]

## 2015-05-23 ENCOUNTER — Other Ambulatory Visit: Payer: Self-pay | Admitting: Advanced Practice Midwife

## 2015-05-23 DIAGNOSIS — Z8759 Personal history of other complications of pregnancy, childbirth and the puerperium: Secondary | ICD-10-CM

## 2015-05-29 ENCOUNTER — Other Ambulatory Visit: Payer: Self-pay | Admitting: Primary Care

## 2015-05-29 DIAGNOSIS — Z3481 Encounter for supervision of other normal pregnancy, first trimester: Secondary | ICD-10-CM

## 2015-06-05 ENCOUNTER — Ambulatory Visit: Payer: Medicaid Other

## 2015-06-15 ENCOUNTER — Other Ambulatory Visit: Payer: Self-pay | Admitting: Primary Care

## 2015-06-15 DIAGNOSIS — Z3689 Encounter for other specified antenatal screening: Secondary | ICD-10-CM

## 2015-06-15 DIAGNOSIS — Z3482 Encounter for supervision of other normal pregnancy, second trimester: Secondary | ICD-10-CM

## 2015-06-16 ENCOUNTER — Ambulatory Visit
Admission: RE | Admit: 2015-06-16 | Discharge: 2015-06-16 | Disposition: A | Discharge status: Home / Self Care | Payer: Medicaid Other | Source: Ambulatory Visit | Attending: Primary Care | Admitting: Primary Care

## 2015-06-16 ENCOUNTER — Emergency Department
Admission: EM | Admit: 2015-06-16 | Discharge: 2015-06-16 | Disposition: A | Discharge status: Home / Self Care | Payer: Medicaid Other | Attending: Emergency Medicine | Admitting: Emergency Medicine

## 2015-06-16 ENCOUNTER — Encounter: Payer: Self-pay | Admitting: Emergency Medicine

## 2015-06-16 DIAGNOSIS — Z3481 Encounter for supervision of other normal pregnancy, first trimester: Secondary | ICD-10-CM

## 2015-06-16 DIAGNOSIS — Z3A19 19 weeks gestation of pregnancy: Secondary | ICD-10-CM | POA: Insufficient documentation

## 2015-06-16 DIAGNOSIS — N898 Other specified noninflammatory disorders of vagina: Secondary | ICD-10-CM | POA: Insufficient documentation

## 2015-06-16 DIAGNOSIS — Z79899 Other long term (current) drug therapy: Secondary | ICD-10-CM | POA: Insufficient documentation

## 2015-06-16 DIAGNOSIS — O4102X5 Oligohydramnios, second trimester, fetus 5: Secondary | ICD-10-CM | POA: Diagnosis not present

## 2015-06-16 DIAGNOSIS — Z3482 Encounter for supervision of other normal pregnancy, second trimester: Secondary | ICD-10-CM

## 2015-06-16 DIAGNOSIS — Z36 Encounter for antenatal screening of mother: Secondary | ICD-10-CM | POA: Diagnosis present

## 2015-06-16 DIAGNOSIS — O23592 Infection of other part of genital tract in pregnancy, second trimester: Secondary | ICD-10-CM | POA: Diagnosis present

## 2015-06-16 DIAGNOSIS — Z3689 Encounter for other specified antenatal screening: Secondary | ICD-10-CM

## 2015-06-16 HISTORY — DX: Personal history of other complications of pregnancy, childbirth and the puerperium: Z87.59

## 2015-06-16 LAB — HCG, QUANTITATIVE, PREGNANCY: HCG, BETA CHAIN, QUANT, S: 20218 m[IU]/mL — AB (ref <5)

## 2015-06-16 LAB — CBC WITH DIFFERENTIAL/PLATELET
Basophils Absolute: 0 10*3/uL (ref 0–0.1)
Basophils Relative: 1 %
EOS ABS: 0.1 10*3/uL (ref 0–0.7)
EOS PCT: 1 %
HCT: 36 % (ref 35.0–47.0)
HEMOGLOBIN: 12 g/dL (ref 12.0–16.0)
LYMPHS ABS: 1.7 10*3/uL (ref 1.0–3.6)
Lymphocytes Relative: 19 %
MCH: 28.8 pg (ref 26.0–34.0)
MCHC: 33.3 g/dL (ref 32.0–36.0)
MCV: 86.3 fL (ref 80.0–100.0)
MONOS PCT: 9 %
Monocytes Absolute: 0.8 10*3/uL (ref 0.2–0.9)
NEUTROS PCT: 72 %
Neutro Abs: 6.6 10*3/uL — ABNORMAL HIGH (ref 1.4–6.5)
Platelets: 224 10*3/uL (ref 150–440)
RBC: 4.17 MIL/uL (ref 3.80–5.20)
RDW: 13.7 % (ref 11.5–14.5)
WBC: 9.2 10*3/uL (ref 3.6–11.0)

## 2015-06-16 LAB — URINALYSIS COMPLETE WITH MICROSCOPIC (ARMC ONLY)
BILIRUBIN URINE: NEGATIVE
Bacteria, UA: NONE SEEN
GLUCOSE, UA: NEGATIVE mg/dL
HGB URINE DIPSTICK: NEGATIVE
KETONES UR: NEGATIVE mg/dL
Leukocytes, UA: NEGATIVE
NITRITE: NEGATIVE
Protein, ur: NEGATIVE mg/dL
RBC / HPF: NONE SEEN RBC/hpf (ref 0–5)
SPECIFIC GRAVITY, URINE: 1.004 — AB (ref 1.005–1.030)
pH: 8 (ref 5.0–8.0)

## 2015-06-16 LAB — POCT PREGNANCY, URINE: PREG TEST UR: POSITIVE — AB

## 2015-06-16 NOTE — ED Notes (Signed)
Patient presents to the ED with leaking vaginal fluid since Monday.  Patient states when she woke up in the morning Monday, Tuesday, and Wednesday with moist underwear.  Patient just had an ultrasound today where baby was seen with a normal heart beat per patient.  Patient states she has called her doctors at the [facility] multiple times since this started and no one has called her back.  Patient denies abdominal pain.  Patient denies vaginal bleeding.

## 2015-06-16 NOTE — Consult Note (Addendum)
Obstetrics & Gynecology Consultation Note  Date of Consultation: 06/16/2015   Requesting Provider: [facility];  Dr. York CeriseForbach  Primary OBGYN: [facility]  Reason for Consultation: abnormal u/s and vaginal discharge  History of Present Illness: Ms. Theresa Alvarez is a 29 y.o. [id number] (Patient's last menstrual period was 01/30/2015.), with the above CC.  She is @ 19/4 weeks (EDC 5/15, based on LMP, per patient). Preg c/b chornic n/v of pregnancy. Patient is being seen at [facility] and states that she's had an uncomplicated course.  She had her 2nd u/s today at [facility], which was an anatomy scan, and on that u/s it showed oligo at 1.7 and thickened NF at 11mm, concerning for cystic hygroma. The FHR was normal but the scan was limited by the low fluid. The patient also states that she's had some vaginal discharge since Monday but none today or yesterday. No fevers, chills, change in her nausea with rare vomiting, abdominal pain, cramps, dysuria. +ptyalism  Her other four children are fine and without any problems or issues. The father of this baby is different than her priors but he has one son w/o any problems or issues. She denies any genetic issues on her side or the father of the baby. She had an u/s at 5wks at [facility] which was unremarkable.   ROS: A 12-point review of systems was performed and negative, except as stated in the above HPI.  OBGYN History: As per HPI. OB History    Gravida Para Term Preterm AB TAB SAB Ectopic Multiple Living   5 4 4       4      TSVD x4. No   Past Medical History: Past Medical History  Diagnosis Date  . History of pre-eclampsia     Past Surgical History: Past Surgical History  Procedure Laterality Date  . Cholecystectomy  2014    lap    Family History:  Family History  Problem Relation Age of Onset  . Diabetes Father   . Diabetes Maternal Grandmother   . Diabetes Paternal Grandmother    Social History:   Social History   Social History  . Marital Status: Single    Spouse Name: N/A  . Number of Children: 4  . Years of Education: N/A   Occupational History  .      [employer]   Social History Main Topics  . Smoking status: Never Smoker   . Smokeless tobacco: Not on file  . Alcohol Use: No  . Drug Use: Yes    Special: Marijuana     Comment: used to use marijuana, reports stopping three weeks ago.   Marland Kitchen. Sexual Activity: Not on file   Other Topics Concern  . Not on file   Social History Narrative     Allergy: Allergies  Allergen Reactions  . Shrimp [Shellfish Allergy] Swelling    Current Outpatient Medications: No current facility-administered medications for this encounter.   Current Outpatient Prescriptions  Medication Sig Dispense Refill  . prenatal vitamin w/FE, FA (NATACHEW) 29-1 MG CHEW chewable tablet Chew 1 tablet by mouth daily at 12 noon.       Physical Exam:  Current Vital Signs 24h Vital Sign Ranges  T 98.1 F (36.7 C) Temp  Avg: 98.1 F (36.7 C)  Min: 98.1 F (36.7 C)  Max: 98.1 F (36.7 C)  BP 111/69 mmHg BP  Min: 111/69  Max: 111/69  HR 87 Pulse  Avg: 87  Min: 87  Max: 87  RR 16 Resp  Avg: 16  Min: 16  Max: 16  SaO2 100 % Not Delivered SpO2  Avg: 100 %  Min: 100 %  Max: 100 %       24 Hour I/O Current Shift I/O  Time Ins Outs       Body mass index is 28.92 kg/(m^2). General appearance: Well nourished, well developed female in no acute distress.  Neck:  Supple, normal appearance, and no thyromegaly  Cardiovascular:Regular rate and rhythm.  No murmurs, rubs or gallops. Respiratory:  Clear to auscultation bilateral. Normal respiratory effort Abdomen: positive bowel sounds and no masses, hernias; diffusely non tender to palpation, non distended Neuro/Psych:  Normal mood and affect.  Skin:  Warm and dry.  Lymphatic:  No inguinal lymphadenopathy.   Pelvic exam: is not limited by body habitus Sterile spec exam: no pooling, negative cough test,  cervix visually closed and normal white, vaginal discharge in the vault (min to moderate amount)  Laboratory: Nitrazine, fern and wet prep negative except for some rare clue cells and some WBCs  Recent Labs Lab 06/16/15 1457  WBC 9.2  HGB 12.0  HCT 36.0  PLT 224   Results for Theresa, Alvarez (MRN [id number]) as of 06/16/2015 19:18  Ref. Range 06/16/2015 14:46  Appearance Latest Ref Range: CLEAR  CLEAR (A)  Bacteria, UA Latest Ref Range: NONE SEEN  NONE SEEN  Bilirubin Urine Latest Ref Range: NEGATIVE  NEGATIVE  Color, Urine Latest Ref Range: YELLOW  STRAW (A)  Glucose Latest Ref Range: NEGATIVE mg/dL NEGATIVE  Hgb urine dipstick Latest Ref Range: NEGATIVE  NEGATIVE  Ketones, ur Latest Ref Range: NEGATIVE mg/dL NEGATIVE  Leukocytes, UA Latest Ref Range: NEGATIVE  NEGATIVE  Nitrite Latest Ref Range: NEGATIVE  NEGATIVE  pH Latest Ref Range: 5.0-8.0  8.0  Protein Latest Ref Range: NEGATIVE mg/dL NEGATIVE  RBC / HPF Latest Ref Range: 0-5 RBC/hpf NONE SEEN  Specific Gravity, Urine Latest Ref Range: 1.005-1.030  1.004 (L)  Squamous Epithelial / LPF Latest Ref Range: NONE SEEN  0-5 (A)  WBC, UA Latest Ref Range: 0-5 WBC/hpf 0-5   Imaging:  CLINICAL DATA: Gestational age of [redacted] weeks 5 days by prior ultrasound. Evaluate anatomy. Patient reports sensation of leaking fluid.  EXAM: OBSTETRICAL ULTRASOUND >14 WKS  FINDINGS: Number of Fetuses: 1  Heart Rate: 158 bpm  Movement: Yes  Presentation: Cephalic  Previa: No  Placental Location: Fundal  Amniotic Fluid (Subjective): Decreased  Amniotic Fluid (Objective):  Vertical pocket 1.7cm  FETAL BIOMETRY  BPD: 4.3cm 19w 0d  HC: 15.6cm 18w ford  AC: 12.3cm 18w 0d  FL: 2.8cm 18w 3d  Current Mean GA: 18w 3d [place] EDC: 11/14/2015  FETAL ANATOMY  Lateral Ventricles: Appear normal  Thalami/CSP: Appear normal  Posterior Fossa: Appears normal  Nuchal Region: Soft  tissue thickening measuring up to 11 mm, suspicious cystic hygroma.  Upper Lip: Not visualized  Spine: Appears normal  4 Chamber Heart on Left: Appears normal  LVOT: Not visualized  RVOT: Not visualized  Stomach on Left: Appears normal  3 Vessel Cord: Appears normal  Cord Insertion site: Appears normal  Kidneys: Appear normal  Bladder: Appears normal  Extremities: Appear normal  Sex: Not visualized  Technically difficult due to: Fetal position and low fluid.  Maternal Findings:  Cervix: 3.5 cm transabdominally  A small anterior fundal fibroid is seen measuring 1.4 cm.  IMPRESSION: Single living IUP with appropriate growth compared to previous study on 03/17/2015.  Nuchal  soft tissue thickening measuring up to 11 mm, suspicious for cystic hygroma. No other definite fetal anomalies visualized. Recommend MFM referral for consultation and follow-up ultrasound.  Subjectively decreased amniotic fluid volume. Cervix appears closed and normal in length.  These results will be called to the ordering clinician or representative by the Radiologist Assistant, and communication documented in the PACS or zVision Dashboard.   Electronically Signed  By: Myles Rosenthal M.D.  On: 06/16/2015 14:41  Assessment: Ms. Ange is a 29 y.o. [id number] @ 19/4 weeks with oligo and abnormal fetal anatomy u/s. Pt currently stable Plan: Negative SROM eval but may not have been much fluid to expel if she is but no elevated WBC and afebrile along with negative belly exam. PTL precautions given to patient. Follow up GC/CT  U/s findings are concerning for some sort of genetic abnormality, which can also explain the oligo. The implications of this were d/w the patient and she was told that definitive testing may be difficult given the low fluid and that cffDNA testing is limited in these circumstances given that the chance of this being a more rare genetic disorder is  higher; she was also told about increased risk of IUFD. Luckily, the patient already has a 12/29 appt with MFM here at [facility] already scheduled by the HD, for unknown reasons. Nevertheless, the patient knows the importance of the appointment and confirms the date and time with me. I called [facility] as the patient states she may have had genetic testing done at her last visit at the end of November but from 11/29 all they have is standard NOB labs and nothing with afp, tetra or quad in the title.   Total time taking care of the patient was 30 minutes, with greater than 50% of the time spent in face to face interaction with the patient  Cornelia Copa. MD [facility] Pager [phone]

## 2015-06-16 NOTE — Discharge Instructions (Signed)
As we discussed, your ultrasound was concerning for a possible developmental abnormality, but you do not appear to be leaking amniotic fluid at this time.  You were evaluated by Dr[facility]Pickens with [facility], and he feels that there is nothing else to do at this moment and that you should follow up with [facility] as scheduled on December 29.  Return to the emergency department with new or worsening symptoms that concern you.   Oligohydramnios An unborn baby (fetus) lives in the mother's uterus in a sac of amniotic fluid. This fluid:  Protects the fetus from trauma.  Helps the fetus move freely inside the uterus.  Helps the fetal lungs, kidneys, and digestive system develop.  Protects the baby from infections. Oligohydramnios is when there is not enough amniotic fluid in the amniotic sac. If this happens early in pregnancy, a fetus might not develop normally. If this happens in the second half of a pregnancy, a fetus might not grow as much as it should and could cause problems during delivery.  Oligohydramnios can also cause:  Pregnancy loss (miscarriage).  Premature birth.  Deformities of the face or body.  Problems with muscles and bones.  Pressure and compression on the umbilical cord, which decreases oxygen to the fetus.  Lung problems.  Stillbirth. CAUSES A cause cannot always be found.However, possible causes include:  A leak or a tear in the amniotic sac.  A problem with the placenta.  Having identical twins who share the same placenta.  A fetal birth defect. This is usually something in the fetal kidneys or urinary tract that has not developed as it should.  A pregnancy that goes past the due date.  Something that affects the mother's body, such as:  Dehydration.  High blood pressure.  Diabetes.  Some medications (examples include ibuprofen and blood pressure medicines).  A disease that affects the skin, joints, kidneys, and other  organs (systemic lupus).  Birth defects. SYMPTOMS  There may be no symptoms.  Leaking fluid from the vagina.  Measuring smaller than usual uterus at a routine pregnancy exam. DIAGNOSIS To diagnose and evaluate oligohydramnios, your caregiver may:  Order a prenatal ultrasound test. This test:  Measures the amniotic fluid level. This will show if the amount of fluid is right for the stage of pregnancy.  Checks the fetal kidneys.  Checks fetal growth.  Evaluates the placenta.  Confirm that you broke your water, if this is suspected by your caregiver.  Order a nonstress test. This noninvasive test is an assessment of fetal well-being.It monitors the fetal heart rate patterns over a 20-minute period.  Order a biophysical profile. This test measures and evaluates 5 observations of the baby (results of nonstress testing, fetal breathing, movements, muscle tone, and amount of amniotic fluid).  Assess fetal kick counts. Tell your caregiver if the baby appears to become less active.  Order a uterine artery Doppler study. This is a type of ultrasound. It can show if enough blood and nourishment are getting to the fetus through the placenta and umbilical cord.  Check your blood pressure.  Check your blood sugar. TREATMENT Treatment will depend on how low the fluid is, how far along in the pregnancy you are, and your overall health. Treatment options include:  Watching and waiting. You will need to be checked more often than normal.  Increasing your fluid intake. This may be done by mouth, or you might get the fluids through the vein (intravenously, IV).  Delivering the baby  if recommended by your caregiver. HOME CARE INSTRUCTIONS  Only take medicine as directed by your caregiver, especially if you have a medical problem (diabetes, high blood pressure).  Follow your caregiver's instructions regarding physical activity, especially if you have a medical problem (diabetes, high  blood pressure).  Keep all prenatal care appointments.  Rest as much as possible. Your caregiver may put you on bed rest.  Drink enough fluids to keep your urine clear or pale yellow.  Eat a healthy and nourishing diet.  Do not smoke, drink alcohol, or take illegal drugs. SEEK MEDICAL CARE IF:  You have any questions or worries about your pregnancy.  You notice a decrease in fetal movement. SEEK IMMEDIATE MEDICAL CARE IF:   Fluid comes out of your vagina.  You start to have labor pains (contractions).  You have a fever.   This information is not intended to replace advice given to you by your health care provider. Make sure you discuss any questions you have with your health care provider.   Document Released: 09/25/2010 Document Revised: 07/01/2014 Document Reviewed: 09/25/2010 Elsevier Interactive Patient Education [employer]

## 2015-06-16 NOTE — ED Provider Notes (Signed)
[facility] Emergency Department Provider Note  ____________________________________________  Time seen: Approximately 5:51 PM  I have reviewed the triage vital signs and the nursing notes.   HISTORY  Chief Complaint Vaginal Discharge    HPI DAYTONA HEDMAN is a 29 y.o. female G5P4 @ [redacted] weeks gestation who goes to the [facility] for prenatal care who presents from the ultrasound clinic with concern for decreased amniotic fluid well as about 5 days of clear fluid leakage that she is not sure from where it originated.  Her ultrasound was part of her usual prenatal care but she was sent directly over to the emergency department given the abnormal results which include an abnormal nuchal finding as well as oligohydramnios.  Other then the fluid leakage which she describes as intermittent and moderate, she has had no symptoms including no vaginal bleeding, no fever/chills, no abdominal pain, no pelvic pain.  She also denies shortness of breath, headaches, swelling in her extremities.   Past Medical History  Diagnosis Date  . History of pre-eclampsia     There are no active problems to display for this patient.   Past Surgical History  Procedure Laterality Date  . Cholecystectomy  2014    lap    Current Outpatient Rx  Name  Route  Sig  Dispense  Refill  . prenatal vitamin w/FE, FA (NATACHEW) 29-1 MG CHEW chewable tablet   Oral   Chew 1 tablet by mouth daily at 12 noon.           Allergies Shrimp  Family History  Problem Relation Age of Onset  . Diabetes Father   . Diabetes Maternal Grandmother   . Diabetes Paternal Grandmother     Social History Social History  Substance Use Topics  . Smoking status: Never Smoker   . Smokeless tobacco: None  . Alcohol Use: No    Review of Systems Constitutional: No fever/chills Eyes: No visual changes. ENT: No sore throat. Cardiovascular: Denies chest pain. Respiratory: Denies shortness of  breath. Gastrointestinal: No abdominal pain.  No nausea, no vomiting.  No diarrhea.  No constipation. Genitourinary: Negative for dysuria. Musculoskeletal: Negative for back pain. Skin: Negative for rash. Neurological: Negative for headaches, focal weakness or numbness.  10-point ROS otherwise negative.  ____________________________________________   PHYSICAL EXAM:  VITAL SIGNS: ED Triage Vitals  Enc Vitals Group     BP 06/16/15 1443 111/69 mmHg     Pulse Rate 06/16/15 1443 87     Resp 06/16/15 1443 16     Temp 06/16/15 1443 98.1 F (36.7 C)     Temp Source 06/16/15 1443 Oral     SpO2 06/16/15 1443 100 %     Weight 06/16/15 1443 153 lb (69.4 kg)     Height 06/16/15 1443  (1.549 m)     Head Cir --      Peak Flow --      Pain Score 06/16/15 1453 0     Pain Loc --      Pain Edu? --      Excl. in GC? --     Constitutional: Alert and oriented. Well appearing and in no acute distress. Eyes: Conjunctivae are normal. PERRL. EOMI. Head: Atraumatic. Nose: No congestion/rhinnorhea. Mouth/Throat: Mucous membranes are moist.  Oropharynx non-erythematous. Neck: No stridor.   Cardiovascular: Normal rate, regular rhythm. Grossly normal heart sounds.  Good peripheral circulation. Respiratory: Normal respiratory effort.  No retractions. Lungs CTAB. Gastrointestinal: Soft and nontender. No distention. No  abdominal bruits. No CVA tenderness. GU:  Deferred  Musculoskeletal: No lower extremity tenderness nor edema.  No joint effusions. Neurologic:  Normal speech and language. No gross focal neurologic deficits are appreciated.  Skin:  Skin is warm, dry and intact. No rash noted. Psychiatric: Mood and affect are normal. Speech and behavior are normal.  ____________________________________________   LABS (all labs ordered are listed, but only abnormal results are displayed)  Labs Reviewed  HCG, QUANTITATIVE, PREGNANCY - Abnormal; Notable for the following:    hCG, Beta Chain,  Quant, S 20218 (*)    All other components within normal limits  URINALYSIS COMPLETEWITH MICROSCOPIC ([facility] ONLY) - Abnormal; Notable for the following:    Color, Urine STRAW (*)    APPearance CLEAR (*)    Specific Gravity, Urine 1.004 (*)    Squamous Epithelial / LPF 0-5 (*)    All other components within normal limits  CBC WITH DIFFERENTIAL/PLATELET - Abnormal; Notable for the following:    Neutro Abs 6.6 (*)    All other components within normal limits  POCT PREGNANCY, URINE - Abnormal; Notable for the following:    Preg Test, Ur POSITIVE (*)    All other components within normal limits  URINE CULTURE  POC URINE PREG, ED   ____________________________________________  EKG  Not indicated ____________________________________________  RADIOLOGY   [place] Ob Comp + 14 Wk  06/16/2015  CLINICAL DATA:  Gestational age of [redacted] weeks 5 days by prior ultrasound. Evaluate anatomy. Patient reports sensation of leaking fluid. EXAM: OBSTETRICAL ULTRASOUND >14 WKS FINDINGS: Number of Fetuses: 1 Heart Rate:  158 bpm Movement: Yes Presentation: Cephalic Previa: No Placental Location: Fundal Amniotic Fluid (Subjective): Decreased Amniotic Fluid (Objective): Vertical pocket 1.7cm FETAL BIOMETRY BPD:  4.3cm 19w 0d HC:    15.6cm  18w   ford AC:   12.3cm  18w   0d FL:   2.8cm  18w   3d Current Mean GA: 18w 3d              US EDC: 11/14/2015 FETAL ANATOMY Lateral Ventricles: Appear normal Thalami/CSP: Appear normal Posterior Fossa:  Appears normal Nuchal Region: Soft tissue thickening measuring up to 11 mm, suspicious cystic hygroma. Upper Lip: Not visualized Spine: Appears normal 4 Chamber Heart on Left: Appears normal LVOT: Not visualized RVOT: Not visualized Stomach on Left: Appears normal 3 Vessel Cord: Appears normal Cord Insertion site: Appears normal Kidneys: Appear normal Bladder: Appears normal Extremities: Appear normal Sex: Not visualized Technically difficult due to: Fetal position and low fluid.  Maternal Findings: Cervix:  3.5 cm transabdominally A small anterior fundal fibroid is seen measuring 1.4 cm. IMPRESSION: Single living IUP with appropriate growth compared to previous study on 03/17/2015. Nuchal soft tissue thickening measuring up to 11 mm, suspicious for cystic hygroma. No other definite fetal anomalies visualized. Recommend MFM referral for consultation and follow-up ultrasound. Subjectively decreased amniotic fluid volume. Cervix appears closed and normal in length. These results will be called to the ordering clinician or representative by the Radiologist Assistant, and communication documented in the PACS or zVision Dashboard. Electronically Signed   By: Myles RosenthalJohn  Stahl M.D.   On: 06/16/2015 14:41    ____________________________________________   PROCEDURES  Procedure(s) performed: None  Critical Care performed: No ____________________________________________   INITIAL IMPRESSION / ASSESSMENT AND PLAN / ED COURSE  Pertinent labs & imaging results that were available during my care of the patient were reviewed by me and considered in my medical decision making (see chart for details).  Patient has stable vital signs is in no acute distress.  She is afebrile.  I deferred the GU exam in favor of her being seen by the [facility] consultant, Dr. Vergie Living.  I called and spoke with him by phone and he came to the emergency department and personally evaluated the patient, and I spoke with him in person as well after the exam.  He feels that the fetus likely has a congenital abnormality and that she has not leaking amniotic fluid at this time.  He is going to contact me after looking at a slide of the microscope but he feels that follow-up next week as the patient currently has scheduled with [facility] is appropriate.  Dr. Vergie Living called back, stated the patient is good to go and follow up as scheduled.  I discussed the patient her results and she is upset but appropriate.  She knows that  follow-up is important.  I gave my usual and customary return precautions.     ____________________________________________  FINAL CLINICAL IMPRESSION(S) / ED DIAGNOSES  Final diagnoses:  Oligohydramnios antepartum, second trimester, fetus 5      NEW MEDICATIONS STARTED DURING THIS VISIT:  New Prescriptions   No medications on file     Loleta Rose, MD 06/16/15 1930

## 2015-06-18 LAB — URINE CULTURE
CULTURE: NO GROWTH
Special Requests: NORMAL

## 2015-06-22 ENCOUNTER — Ambulatory Visit
Admission: RE | Admit: 2015-06-22 | Discharge: 2015-06-22 | Disposition: A | Discharge status: Home / Self Care | Payer: Medicaid Other | Source: Ambulatory Visit | Attending: Obstetrics and Gynecology | Admitting: Obstetrics and Gynecology

## 2015-06-22 VITALS — BP 122/77 | HR 100 | Temp 97.9°F | Resp 18 | Ht 61.2 in | Wt 160.2 lb

## 2015-06-22 DIAGNOSIS — IMO0002 Reserved for concepts with insufficient information to code with codable children: Secondary | ICD-10-CM | POA: Insufficient documentation

## 2015-06-22 DIAGNOSIS — O4290 Premature rupture of membranes, unspecified as to length of time between rupture and onset of labor, unspecified weeks of gestation: Secondary | ICD-10-CM

## 2015-06-22 DIAGNOSIS — Z8759 Personal history of other complications of pregnancy, childbirth and the puerperium: Secondary | ICD-10-CM

## 2015-06-22 DIAGNOSIS — Z8742 Personal history of other diseases of the female genital tract: Secondary | ICD-10-CM | POA: Diagnosis not present

## 2015-06-22 DIAGNOSIS — O26892 Other specified pregnancy related conditions, second trimester: Secondary | ICD-10-CM | POA: Diagnosis not present

## 2015-06-22 DIAGNOSIS — O358XX0 Maternal care for other (suspected) fetal abnormality and damage, not applicable or unspecified: Secondary | ICD-10-CM | POA: Diagnosis not present

## 2015-06-22 DIAGNOSIS — IMO0001 Reserved for inherently not codable concepts without codable children: Secondary | ICD-10-CM

## 2015-06-22 DIAGNOSIS — N898 Other specified noninflammatory disorders of vagina: Secondary | ICD-10-CM

## 2015-06-22 HISTORY — DX: Adverse effect of unspecified anesthetic, initial encounter: T41.45XA

## 2015-06-22 HISTORY — DX: Other complications of anesthesia, initial encounter: T88.59XA

## 2015-06-22 NOTE — Progress Notes (Signed)
29 yo G5 p4 AAF here with her mother with a h/o leaking fluid since 12/20 , now 19 4/7 weeks by a 5 w 5d day scan on 03/17/15 (CRL 2.352mm- 6 d difference from LMP) No fever or chills , feeling fetal movement On exam 12/23 [facility] - pos pool, neg fern neg nitrazine  12/23 U/s concerning for low fluid volume and possible cystic hygroma   Dr Vergie LivingPickens [facility] to [facility] for [facility] consult  Today pt c/o continued leaking requiring pad changes  Pt reports STD screen at [facility] end of November   Filed Vitals:   06/22/15 1222  BP: 122/77  Pulse: 100  Temp: 97.9 F (36.6 C)  Resp: 18   Pleasant AAF in no acute distress EGBUS normal no obvious wetness  On SSE about 2 tbspns of clear non foul liquid in vagina   Cervix closed  nitrazine neg  Wet prep - many squames occ clue cell - many WBCs  No Trich or yeast noted GC not sent Fern positive- slide sent with pt   U/s mild oligohydramnios - 2cm MVP , no obvious anomaly   A/P  IUP at 19 4/7 by early scan  Likely ROM - pos pool and fern , neg nitrazine , mild oligo on scan  Briefly reviewed risk of second trimester PPROM for mother (infection, higher risk of cesarean, hospitalization) and fetus ( cord compression, deformation of extremities, pulmonary hypoplasia ) - doubt re-sealing likely given she has leaked over 10 days  I am encouraged by remaining pockets of fluid  Will send [facility] to [facility] to meet with  MFM and neonatology and discuss options of latency antibiotics and steroids nearer 23 weeks . She has good support from her mother who will drive [facility] to [facility] and care for her kids  Unclear whether prior cystic hygroma is compressed today by oligo or not visualized due to position or was artifact before? I spoke with Dr Link SnufferPickens GC/chlamydia was collected but not sent from here - specimen went with pt   Jimmey RalphLivingston, Noora Locascio

## 2015-07-06 ENCOUNTER — Ambulatory Visit: Payer: Medicaid Other

## 2015-09-25 ENCOUNTER — Emergency Department: Payer: Medicaid Other

## 2015-09-25 ENCOUNTER — Encounter: Payer: Self-pay | Admitting: Emergency Medicine

## 2015-09-25 ENCOUNTER — Emergency Department
Admission: EM | Admit: 2015-09-25 | Discharge: 2015-09-25 | Disposition: A | Discharge status: Home / Self Care | Payer: Medicaid Other | Attending: Emergency Medicine | Admitting: Emergency Medicine

## 2015-09-25 DIAGNOSIS — O209 Hemorrhage in early pregnancy, unspecified: Secondary | ICD-10-CM | POA: Insufficient documentation

## 2015-09-25 DIAGNOSIS — Z3A01 Less than 8 weeks gestation of pregnancy: Secondary | ICD-10-CM | POA: Insufficient documentation

## 2015-09-25 DIAGNOSIS — F1299 Cannabis use, unspecified with unspecified cannabis-induced disorder: Secondary | ICD-10-CM | POA: Diagnosis not present

## 2015-09-25 DIAGNOSIS — O210 Mild hyperemesis gravidarum: Secondary | ICD-10-CM

## 2015-09-25 DIAGNOSIS — O219 Vomiting of pregnancy, unspecified: Secondary | ICD-10-CM | POA: Diagnosis not present

## 2015-09-25 LAB — CBC
HCT: 37.2 % (ref 35.0–47.0)
Hemoglobin: 12.4 g/dL (ref 12.0–16.0)
MCH: 25.9 pg — AB (ref 26.0–34.0)
MCHC: 33.3 g/dL (ref 32.0–36.0)
MCV: 78 fL — ABNORMAL LOW (ref 80.0–100.0)
PLATELETS: 301 10*3/uL (ref 150–440)
RBC: 4.77 MIL/uL (ref 3.80–5.20)
RDW: 17.6 % — AB (ref 11.5–14.5)
WBC: 12.3 10*3/uL — ABNORMAL HIGH (ref 3.6–11.0)

## 2015-09-25 LAB — HCG, QUANTITATIVE, PREGNANCY: HCG, BETA CHAIN, QUANT, S: 111332 m[IU]/mL — AB (ref <5)

## 2015-09-25 LAB — POCT PREGNANCY, URINE: Preg Test, Ur: POSITIVE — AB

## 2015-09-25 LAB — ABO/RH: ABO/RH(D): O POS

## 2015-09-25 MED ORDER — ONDANSETRON 4 MG PO TBDP
ORAL_TABLET | ORAL | Status: AC
  Filled 2015-09-25: qty 1

## 2015-09-25 MED ORDER — METOCLOPRAMIDE HCL 10 MG PO TABS: 10.0000 mg | ORAL_TABLET | Freq: Three times a day (TID) | ORAL | Status: DC | PRN

## 2015-09-25 MED ORDER — ONDANSETRON 4 MG PO TBDP
4.0000 mg | ORAL_TABLET | ORAL | Status: AC
  Administered 2015-09-25: 4 mg via ORAL

## 2015-09-25 NOTE — ED Provider Notes (Signed)
[facility] Emergency Department Provider Note  ____________________________________________  Time seen: 2:10 PM  I have reviewed the triage vital signs and the nursing notes.   HISTORY  Chief Complaint Vaginal Bleeding    HPI Theresa Alvarez is a 30 y.o. female who reports being [redacted] weeks pregnant. G6 P4, LMP 07/30/2015. [facility] today, but when they learned that she had been spotting for a week they sent her to the emergency department for evaluation. Eyes any pain. No chest pain shortness of breath abdominal pain pelvic pain or dizziness. Has had nausea vomiting for the past week.  No fevers or chills. Is tolerating oral intake. Is taking prenatal vitamins. No dysuria frequency urgency. No vaginal discharge.     Past Medical History  Diagnosis Date  . History of pre-eclampsia   . Complication of anesthesia      Patient Active Problem List   Diagnosis Date Noted  . Vaginal discharge during pregnancy in second trimester 06/22/2015  . Fetal cystic hygroma? 06/22/2015  . mild oligohydramnios  06/22/2015     Past Surgical History  Procedure Laterality Date  . Cholecystectomy  2014    lap     Current Outpatient Rx  Name  Route  Sig  Dispense  Refill  . metoCLOPramide (REGLAN) 10 MG tablet   Oral   Take 1 tablet (10 mg total) by mouth every 8 (eight) hours as needed for nausea.   60 tablet   0   . prenatal vitamin w/FE, FA (NATACHEW) 29-1 MG CHEW chewable tablet   Oral   Chew 1 tablet by mouth daily at 12 noon.            Allergies Shrimp   Family History  Problem Relation Age of Onset  . Diabetes Father   . Diabetes Maternal Grandmother   . Diabetes Paternal Grandmother     Social History Social History  Substance Use Topics  . Smoking status: Never Smoker   . Smokeless tobacco: Never Used  . Alcohol Use: No    Review of Systems  Constitutional:   No fever  or chills. No weight changes Eyes:   No vision changes.  ENT:   No sore throat. No rhinorrhea. Cardiovascular:   No chest pain. Respiratory:   No dyspnea or cough. Gastrointestinal:   Negative for abdominal pain, daily vomiting. No diarrhea..  No BRBPR or melena. Genitourinary:   Negative for dysuria or difficulty urinating. Musculoskeletal:   Negative for focal pain or swelling Skin:   Negative for rash. Neurological:   Negative for headaches, focal weakness or numbness.  10-point ROS otherwise negative.  ____________________________________________   PHYSICAL EXAM:  VITAL SIGNS: ED Triage Vitals  Enc Vitals Group     BP 09/25/15 1054 132/81 mmHg     Pulse Rate 09/25/15 1054 107     Resp 09/25/15 1054 16     Temp 09/25/15 1054 98.5 F (36.9 C)     Temp Source 09/25/15 1054 Oral     SpO2 09/25/15 1054 100 %     Weight 09/25/15 1054 162 lb (73.483 kg)     Height 09/25/15 1054 5\' 1"  (1.549 m)     Head Cir --      Peak Flow --      Pain Score 09/25/15 1054 3     Pain Loc --      Pain Edu? --      Excl. in GC? --  Vital signs reviewed, nursing assessments reviewed.   Constitutional:   Alert and oriented. Well appearing and in no distress. Eyes:   No scleral icterus. No conjunctival pallor. PERRL. EOMI ENT   Head:   Normocephalic and atraumatic.   Nose:   No congestion/rhinnorhea. No septal hematoma   Mouth/Throat:   MMM, no pharyngeal erythema. No peritonsillar mass.    Neck:   No stridor. No SubQ emphysema. No meningismus. Hematological/Lymphatic/Immunilogical:   No cervical lymphadenopathy. Cardiovascular:   RRR. Symmetric bilateral radial and DP pulses.  No murmurs.  Respiratory:   Normal respiratory effort without tachypnea nor retractions. Breath sounds are clear and equal bilaterally. No wheezes/rales/rhonchi. Gastrointestinal:   Soft and nontender. Non distended. There is no CVA tenderness.  No rebound, rigidity, or guarding. Genitourinary:    deferred Musculoskeletal:   Nontender with normal range of motion in all extremities. No joint effusions.  No lower extremity tenderness.  No edema. Neurologic:   Normal speech and language.  CN 2-10 normal. Motor grossly intact. No gross focal neurologic deficits are appreciated.  Skin:    Skin is warm, dry and intact. No rash noted.  No petechiae, purpura, or bullae. Psychiatric:   Mood and affect are normal. ____________________________________________    LABS (pertinent positives/negatives) (all labs ordered are listed, but only abnormal results are displayed) Labs Reviewed  CBC - Abnormal; Notable for the following:    WBC 12.3 (*)    MCV 78.0 (*)    MCH 25.9 (*)    RDW 17.6 (*)    All other components within normal limits  HCG, QUANTITATIVE, PREGNANCY - Abnormal; Notable for the following:    hCG, Beta Chain, Mahalia Longest [id number] (*)    All other components within normal limits  POCT PREGNANCY, URINE - Abnormal; Notable for the following:    Preg Test, Ur POSITIVE (*)    All other components within normal limits  POC URINE PREG, ED  ABO/RH   ____________________________________________   EKG    ____________________________________________    RADIOLOGY  Pelvic ultrasound reveals single live IUP, size consistent with dates, fetal heart rate 162. No evidence of ectopic.  ____________________________________________   PROCEDURES   ____________________________________________   INITIAL IMPRESSION / ASSESSMENT AND PLAN / ED COURSE  Pertinent labs & imaging results that were available during my care of the patient were reviewed by me and considered in my medical decision making (see chart for details).  Patient well appearing no acute distress. Mildly dehydrated due to morning sickness but not hyperemesis. Has not been taking any anti-emetics. Labs reassuring. Ultrasound reassuring. Exam benign. We'll start her on antiemetics, follow up with health  department.     ____________________________________________   FINAL CLINICAL IMPRESSION(S) / ED DIAGNOSES  Final diagnoses:  Vaginal bleeding in pregnancy, first trimester  Morning sickness      Sharman Cheek, MD 09/25/15 1535

## 2015-09-25 NOTE — Discharge Instructions (Signed)
Eating Plan for Hyperemesis Gravidarum Severe cases of hyperemesis gravidarum can lead to dehydration and malnutrition. The hyperemesis eating plan is one way to lessen the symptoms of nausea and vomiting. It is often used with prescribed medicines to control your symptoms.  WHAT CAN I DO TO RELIEVE MY SYMPTOMS? Listen to your body. Everyone is different and has different preferences. Find what works best for you. Some of the following things may help:  Eat and drink slowly.  Eat 5-6 small meals daily instead of 3 large meals.   Eat crackers before you get out of bed in the morning.   Starchy foods are usually well tolerated (such as cereal, toast, bread, potatoes, pasta, rice, and pretzels).   Ginger may help with nausea. Add  tsp ground ginger to hot tea or choose ginger tea.   Try drinking 100% fruit juice or an electrolyte drink.  Continue to take your prenatal vitamins as directed by your health care provider. If you are having trouble taking your prenatal vitamins, talk with your health care provider about different options.  Include at least 1 serving of protein with your meals and snacks (such as meats or poultry, beans, nuts, eggs, or yogurt). Try eating a protein-rich snack before bed (such as cheese and crackers or a half [place] or peanut butter sandwich). WHAT THINGS SHOULD I AVOID TO REDUCE MY SYMPTOMS? The following things may help reduce your symptoms:  Avoid foods with strong smells. Try eating meals in well-ventilated areas that are free of odors.  Avoid drinking water or other beverages with meals. Try not to drink anything less than 30 minutes before and after meals.  Avoid drinking more than 1 cup of fluid at a time.  Avoid fried or high-fat foods, such as butter and cream sauces.  Avoid spicy foods.  Avoid skipping meals the best you can. Nausea can be more intense on an empty stomach. If you cannot tolerate food at that time, do not force it. Try sucking on  ice chips or other frozen items and make up the calories later.  Avoid lying down within 2 hours after eating.   This information is not intended to replace advice given to you by your health care provider. Make sure you discuss any questions you have with your health care provider.   Document Released: 04/07/2007 Document Revised: 06/15/2013 Document Reviewed: 04/14/2013 Elsevier Interactive Patient Education 2016 Elsevier Inc.  Morning Sickness Morning sickness is when you feel sick to your stomach (nauseous) during pregnancy. This nauseous feeling may or may not come with vomiting. It often occurs in the morning but can be a problem any time of day. Morning sickness is most common during the first trimester, but it may continue throughout pregnancy. While morning sickness is unpleasant, it is usually harmless unless you develop severe and continual vomiting (hyperemesis gravidarum). This condition requires more intense treatment.  CAUSES  The cause of morning sickness is not completely known but seems to be related to normal hormonal changes that occur in pregnancy. RISK FACTORS You are at greater risk if you:  Experienced nausea or vomiting before your pregnancy.  Had morning sickness during a previous pregnancy.  Are pregnant with more than one baby, such as twins. TREATMENT  Do not use any medicines (prescription, over-the-counter, or herbal) for morning sickness without first talking to your health care provider. Your health care provider may prescribe or recommend:  Vitamin B6 supplements.  Anti-nausea medicines.  The herbal medicine ginger. HOME CARE INSTRUCTIONS  Only take over-the-counter or prescription medicines as directed by your health care provider.  Taking multivitamins before getting pregnant can prevent or decrease the severity of morning sickness in most women.  Eat a piece of dry toast or unsalted crackers before getting out of bed in the morning.  Eat  five or six small meals a day.  Eat dry and bland foods (rice, baked potato). Foods high in carbohydrates are often helpful.  Do not drink liquids with your meals. Drink liquids between meals.  Avoid greasy, fatty, and spicy foods.  Get someone to cook for you if the smell of any food causes nausea and vomiting.  If you feel nauseous after taking prenatal vitamins, take the vitamins at night or with a snack.  Snack on protein foods (nuts, yogurt, cheese) between meals if you are hungry.  Eat unsweetened gelatins for desserts.  Wearing an acupressure wristband (worn for sea sickness) may be helpful.  Acupuncture may be helpful.  Do not smoke.  Get a humidifier to keep the air in your house free of odors.  Get plenty of fresh air. SEEK MEDICAL CARE IF:   Your home remedies are not working, and you need medicine.  You feel dizzy or lightheaded.  You are losing weight. SEEK IMMEDIATE MEDICAL CARE IF:   You have persistent and uncontrolled nausea and vomiting.  You pass out (faint). MAKE SURE YOU:  Understand these instructions.  Will watch your condition.  Will get help right away if you are not doing well or get worse.   This information is not intended to replace advice given to you by your health care provider. Make sure you discuss any questions you have with your health care provider.   Document Released: 08/01/2006 Document Revised: 06/15/2013 Document Reviewed: 11/25/2012 Elsevier Interactive Patient Education 2016 Elsevier Inc.  Vaginal Bleeding During Pregnancy, First Trimester A small amount of bleeding (spotting) from the vagina is relatively common in early pregnancy. It usually stops on its own. Various things may cause bleeding or spotting in early pregnancy. Some bleeding may be related to the pregnancy, and some may not. In most cases, the bleeding is normal and is not a problem. However, bleeding can also be a sign of something serious. Be sure to  tell your health care provider about any vaginal bleeding right away. Some possible causes of vaginal bleeding during the first trimester include:  Infection or inflammation of the cervix.  Growths (polyps) on the cervix.  Miscarriage or threatened miscarriage.  Pregnancy tissue has developed outside of the uterus and in a fallopian tube (tubal pregnancy).  Tiny cysts have developed in the uterus instead of pregnancy tissue (molar pregnancy). HOME CARE INSTRUCTIONS  Watch your condition for any changes. The following actions may help to lessen any discomfort you are feeling:  Follow your health care provider's instructions for limiting your activity. If your health care provider orders bed rest, you may need to stay in bed and only get up to use the bathroom. However, your health care provider may allow you to continue light activity.  If needed, make plans for someone to help with your regular activities and responsibilities while you are on bed rest.  Keep track of the number of pads you use each day, how often you change pads, and how soaked (saturated) they are. Write this down.  Do not use tampons. Do not douche.  Do not have sexual intercourse or orgasms until approved by your health care provider.  If you  pass any tissue from your vagina, save the tissue so you can show it to your health care provider.  Only take over-the-counter or prescription medicines as directed by your health care provider.  Do not take aspirin because it can make you bleed.  Keep all follow-up appointments as directed by your health care provider. SEEK MEDICAL CARE IF:  You have any vaginal bleeding during any part of your pregnancy.  You have cramps or labor pains.  You have a fever, not controlled by medicine. SEEK IMMEDIATE MEDICAL CARE IF:   You have severe cramps in your back or belly (abdomen).  You pass large clots or tissue from your vagina.  Your bleeding increases.  You feel  light-headed or weak, or you have fainting episodes.  You have chills.  You are leaking fluid or have a gush of fluid from your vagina.  You pass out while having a bowel movement. MAKE SURE YOU:  Understand these instructions.  Will watch your condition.  Will get help right away if you are not doing well or get worse.   This information is not intended to replace advice given to you by your health care provider. Make sure you discuss any questions you have with your health care provider.   Document Released: 03/20/2005 Document Revised: 06/15/2013 Document Reviewed: 02/15/2013 Elsevier Interactive Patient Education [employer] Elsevier Inc.

## 2015-09-25 NOTE — ED Notes (Signed)
Pt went to health dept for maternity care and told them she has been spotting.  They told her to come to ED since she miscarried last December.  No prenatal care yet.  EDC by [facility]MP 05/06/16.  Has also had lower abdominal cramping.  [id number].

## 2015-09-25 NOTE — ED Notes (Signed)
Pt reports she is [redacted] weeks pregnant.  [id number]  Pt is spotting for 1 week.  No cramping.  No dysuria.  No vag discharge.  Pt seen at health dept today and was told to come to er for eval.  Pt had miscarriage 12-16.

## 2016-04-26 ENCOUNTER — Encounter (HOSPITAL_COMMUNITY): Payer: Self-pay

## 2017-07-28 ENCOUNTER — Other Ambulatory Visit: Payer: Self-pay

## 2017-07-28 ENCOUNTER — Emergency Department: Payer: Self-pay

## 2017-07-28 ENCOUNTER — Encounter: Payer: Self-pay | Admitting: Emergency Medicine

## 2017-07-28 ENCOUNTER — Emergency Department
Admission: EM | Admit: 2017-07-28 | Discharge: 2017-07-28 | Disposition: A | Discharge status: Home / Self Care | Payer: Self-pay | Attending: Emergency Medicine | Admitting: Emergency Medicine

## 2017-07-28 DIAGNOSIS — Z79899 Other long term (current) drug therapy: Secondary | ICD-10-CM | POA: Insufficient documentation

## 2017-07-28 DIAGNOSIS — R112 Nausea with vomiting, unspecified: Secondary | ICD-10-CM

## 2017-07-28 DIAGNOSIS — Z9049 Acquired absence of other specified parts of digestive tract: Secondary | ICD-10-CM | POA: Insufficient documentation

## 2017-07-28 DIAGNOSIS — K5289 Other specified noninfective gastroenteritis and colitis: Secondary | ICD-10-CM | POA: Insufficient documentation

## 2017-07-28 LAB — URINALYSIS, COMPLETE (UACMP) WITH MICROSCOPIC
BILIRUBIN URINE: NEGATIVE
GLUCOSE, UA: NEGATIVE mg/dL
Hgb urine dipstick: NEGATIVE
KETONES UR: 5 mg/dL — AB
LEUKOCYTES UA: NEGATIVE
NITRITE: NEGATIVE
PROTEIN: 100 mg/dL — AB
Specific Gravity, Urine: 1.019 (ref 1.005–1.030)
pH: 6 (ref 5.0–8.0)

## 2017-07-28 LAB — COMPREHENSIVE METABOLIC PANEL
ALK PHOS: 74 U/L (ref 38–126)
ALT: 57 U/L — AB (ref 14–54)
AST: 47 U/L — AB (ref 15–41)
Albumin: 4.3 g/dL (ref 3.5–5.0)
Anion gap: 12 (ref 5–15)
BILIRUBIN TOTAL: 2 mg/dL — AB (ref 0.3–1.2)
BUN: 7 mg/dL (ref 6–20)
CALCIUM: 9.3 mg/dL (ref 8.9–10.3)
CHLORIDE: 100 mmol/L — AB (ref 101–111)
CO2: 22 mmol/L (ref 22–32)
CREATININE: 0.88 mg/dL (ref 0.44–1.00)
GFR calc Af Amer: >60 mL/min (ref >60)
GFR calc non Af Amer: >60 mL/min (ref >60)
Glucose, Bld: 119 mg/dL — ABNORMAL HIGH (ref 65–99)
Potassium: 2.8 mmol/L — ABNORMAL LOW (ref 3.5–5.1)
Sodium: 134 mmol/L — ABNORMAL LOW (ref 135–145)
Total Protein: 8.8 g/dL — ABNORMAL HIGH (ref 6.5–8.1)

## 2017-07-28 LAB — CBC
HCT: 42.8 % (ref 35.0–47.0)
Hemoglobin: 14.3 g/dL (ref 12.0–16.0)
MCH: 26.5 pg (ref 26.0–34.0)
MCHC: 33.4 g/dL (ref 32.0–36.0)
MCV: 79.5 fL — AB (ref 80.0–100.0)
PLATELETS: 305 10*3/uL (ref 150–440)
RBC: 5.39 MIL/uL — ABNORMAL HIGH (ref 3.80–5.20)
RDW: 14.5 % (ref 11.5–14.5)
WBC: 10.7 10*3/uL (ref 3.6–11.0)

## 2017-07-28 LAB — LIPASE, BLOOD: LIPASE: 19 U/L (ref 11–51)

## 2017-07-28 MED ORDER — LOPERAMIDE HCL 2 MG PO CAPS
4.0000 mg | ORAL_CAPSULE | Freq: Once | ORAL | Status: AC
  Administered 2017-07-28: 4 mg via ORAL

## 2017-07-28 MED ORDER — POTASSIUM CHLORIDE CRYS ER 20 MEQ PO TBCR
40.0000 meq | EXTENDED_RELEASE_TABLET | Freq: Once | ORAL | Status: AC
  Administered 2017-07-28: 40 meq via ORAL

## 2017-07-28 MED ORDER — POTASSIUM CHLORIDE CRYS ER 20 MEQ PO TBCR
EXTENDED_RELEASE_TABLET | ORAL | Status: AC
  Administered 2017-07-28: 40 meq via ORAL
  Filled 2017-07-28: qty 2

## 2017-07-28 MED ORDER — ONDANSETRON HCL 4 MG/2ML IJ SOLN
INTRAMUSCULAR | Status: AC
  Administered 2017-07-28: 4 mg via INTRAVENOUS
  Filled 2017-07-28: qty 2

## 2017-07-28 MED ORDER — POTASSIUM CHLORIDE CRYS ER 10 MEQ PO TBCR: 10.0000 meq | EXTENDED_RELEASE_TABLET | Freq: Two times a day (BID) | ORAL | 0 refills | Status: DC

## 2017-07-28 MED ORDER — LOPERAMIDE HCL 2 MG PO CAPS
ORAL_CAPSULE | ORAL | Status: AC
  Administered 2017-07-28: 4 mg via ORAL
  Filled 2017-07-28: qty 2

## 2017-07-28 MED ORDER — ONDANSETRON HCL 4 MG/2ML IJ SOLN
4.0000 mg | Freq: Once | INTRAMUSCULAR | Status: AC
  Administered 2017-07-28: 4 mg via INTRAVENOUS

## 2017-07-28 MED ORDER — SODIUM CHLORIDE 0.9 % IV BOLUS (SEPSIS)
1000.0000 mL | Freq: Once | INTRAVENOUS | Status: AC
  Administered 2017-07-28: 1000 mL via INTRAVENOUS

## 2017-07-28 MED ORDER — ONDANSETRON 4 MG PO TBDP: 4.0000 mg | ORAL_TABLET | Freq: Three times a day (TID) | ORAL | 0 refills | Status: DC | PRN

## 2017-07-28 MED ORDER — LOPERAMIDE HCL 2 MG PO TABS: 2.0000 mg | ORAL_TABLET | Freq: Four times a day (QID) | ORAL | 0 refills | Status: DC | PRN

## 2017-07-28 NOTE — ED Notes (Signed)
Pt given ice chips

## 2017-07-28 NOTE — ED Notes (Signed)
Urine preg negative

## 2017-07-28 NOTE — ED Triage Notes (Signed)
Says she has been vomiting anything she eats for several days.  She also has diarrhea.  Says it started with a cough, and she continues to cough as well.

## 2017-07-28 NOTE — ED Provider Notes (Signed)
[facility] Emergency Department Provider Note  Time seen: 5:24 PM  I have reviewed the triage vital signs and the nursing notes.   HISTORY  Chief Complaint Emesis and Diarrhea    HPI Theresa Alvarez is a 32 y.o. female with a past medical history of a prior cholecystectomy presents to the emergency department for nausea vomiting diarrhea.  According to the patient approximately 2 weeks ago she developed cough and congestion.  Continues to have a mild cough although that is somewhat improved.  For the past 5 or 6 days she has had very frequent episodes of nausea vomiting and diarrhea.  States her last episode of vomiting and diarrhea were earlier this morning.  Denies any abdominal pain besides a cramping sensation at times.  Denies any known fever.  Patient states she has not been able to keep down much orally due to nausea and vomiting.  Past Medical History:  Diagnosis Date  . Complication of anesthesia   . History of pre-eclampsia     Patient Active Problem List   Diagnosis Date Noted  . Vaginal discharge during pregnancy in second trimester 06/22/2015  . Fetal cystic hygroma? 06/22/2015  . mild oligohydramnios  06/22/2015    Past Surgical History:  Procedure Laterality Date  . CHOLECYSTECTOMY  2014   lap    Prior to Admission medications   Medication Sig Start Date End Date Taking? Authorizing Provider  metoCLOPramide (REGLAN) 10 MG tablet Take 1 tablet (10 mg total) by mouth every 8 (eight) hours as needed for nausea. 09/25/15   Sharman CheekStafford, Phillip, MD  prenatal vitamin w/FE, FA (NATACHEW) 29-1 MG CHEW chewable tablet Chew 1 tablet by mouth daily at 12 noon.    [provider]    Allergies  Allergen Reactions  . Shrimp [Shellfish Allergy] Swelling    Family History  Problem Relation Age of Onset  . Diabetes Maternal Grandmother   . Diabetes Father   . Diabetes Paternal Grandmother     Social History Social History   Tobacco  Use  . Smoking status: Never Smoker  . Smokeless tobacco: Never Used  Substance Use Topics  . Alcohol use: No  . Drug use: Yes    Types: Marijuana    Comment: MJ use one week ago    Review of Systems Constitutional: Negative for fever. Eyes: Negative for visual complaints ENT: Mild congestion Cardiovascular: Negative for chest pain. Respiratory: Negative for shortness of breath.  Positive for occasional cough Gastrointestinal: Abdominal cramping.  Positive for nausea vomiting diarrhea Genitourinary: Negative for urinary compaints Musculoskeletal: Negative for leg pain or swelling Skin: Negative for skin complaints  Neurological: Negative for headache All other ROS negative  ____________________________________________   PHYSICAL EXAM:  VITAL SIGNS: ED Triage Vitals  Enc Vitals Group     BP 07/28/17 1403 (!) 119/95     Pulse Rate 07/28/17 1403 (!) 123     Resp 07/28/17 1403 18     Temp 07/28/17 1403 98.2 F (36.8 C)     Temp Source 07/28/17 1403 Oral     SpO2 07/28/17 1403 98 %     Weight 07/28/17 1405 178 lb (80.7 kg)     Height 07/28/17 1405 5\' 1"  (1.549 m)     Head Circumference --      Peak Flow --      Pain Score 07/28/17 1404 5     Pain Loc --      Pain Edu? --  Excl. in GC? --    Constitutional: Alert and oriented. Well appearing and in no distress. Eyes: Normal exam ENT   Head: Normocephalic and atraumatic.  Mild nasal congestion.   Mouth/Throat: Mucous membranes are moist. Cardiovascular: Normal rate, regular rhythm around 100 bpm.  No murmur. Respiratory: Normal respiratory effort without tachypnea nor retractions. Breath sounds are clear  Gastrointestinal: Soft and nontender. No distention.  Musculoskeletal: Nontender with normal range of motion in all extremities. No lower extremity tenderness or edema. Neurologic:  Normal speech and language. No gross focal neurologic deficits  Skin:  Skin is warm, dry and intact.  Psychiatric: Mood and  affect are normal.   ____________________________________________    RADIOLOGY  Chest x-ray negative  ____________________________________________   INITIAL IMPRESSION / ASSESSMENT AND PLAN / ED COURSE  Pertinent labs & imaging results that were available during my care of the patient were reviewed by me and considered in my medical decision making (see chart for details).  Patient presents to the emergency department for nausea vomiting diarrhea over the past 5-6 days along with cough congestion times 2 weeks.  Patient symptoms are very suggestive of viral gastroenteritis/viral illness.  Patient's labs show mild hypokalemia likely due to frequent vomiting, mild LFT elevation patient is status post cholecystectomy, no right upper quadrant pain.  Largely nontender abdomen, no tenderness to my palpation.  White blood cell count is normal.  Vitals are largely within normal limits.  We will dose IV fluids and Zofran.  Patient states she has not taken anything for the diarrhea we will dose oral loperamide in the emergency department as well as oral potassium.  Overall the patient appears well, nontoxic.  Differential would include gastroenteritis, enteritis, viral illness, URI, less likely intra-abdominal pathology given no abdominal tenderness to palpation and a normal white blood cell count.  Doing better after fluids.  Urinalysis shows a mild amount of white cells, no dysuria.  Mild amount of ketones..  Urine pregnancy test is negative.  Urine culture has been sent.  Patient feeling better after medications.  We will discharge with loperamide, Zofran, discussed supportive care at home including plenty of fluids as well as a short course of potassium supplementation.  Patient agreeable to plan will follow up with her doctor.  ____________________________________________   FINAL CLINICAL IMPRESSION(S) / ED DIAGNOSES  Gastroenteritis    Minna Antis, MD 07/28/17 1925

## 2017-07-28 NOTE — ED Notes (Signed)
Pt states she began with cold symptoms on 1/22 and started with N&V&D last Tuesday. States she hasn't eaten anything in 5 days. Alert, oriented. Weakness and fatigue. No distress notd.

## 2017-07-29 IMAGING — US US MFM OB DETAIL+14 WK
1 series · 14 of 28 positions shown · non-contrast
Comparison: none

[Series 1: us mfm ob detail+14 wk · 0.17mm/px · 14 of 96 slices shown]
[im 4/96]
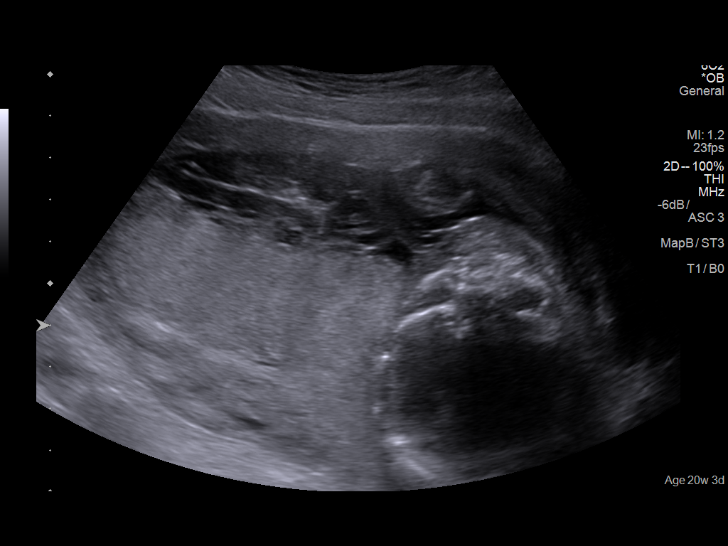
[im 11/96]
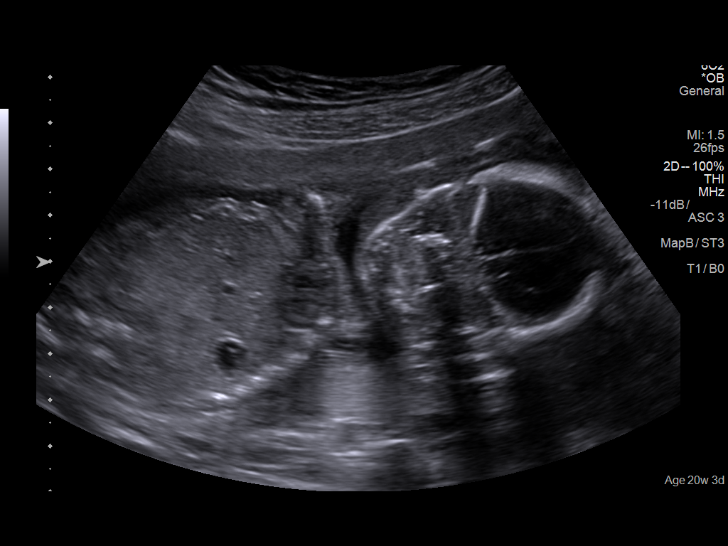
[im 18/96]
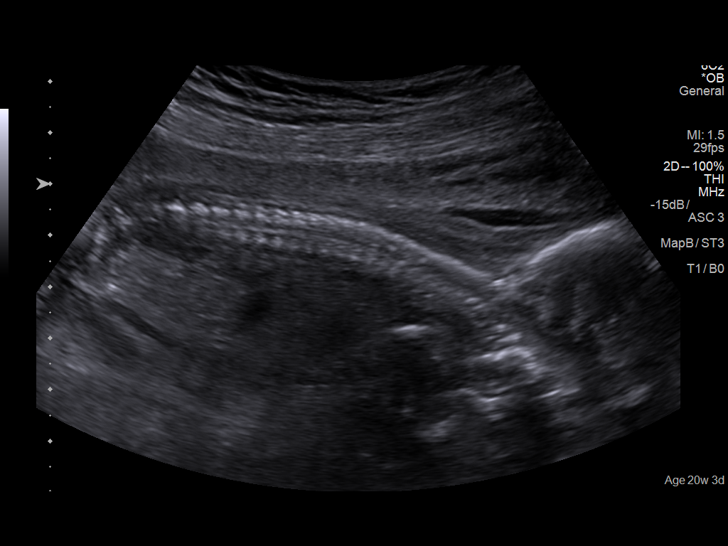
[im 25/96]
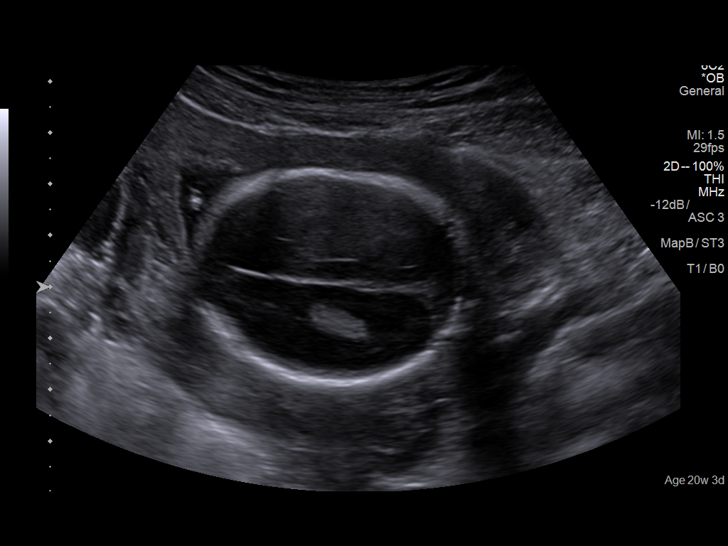
[im 32/96]
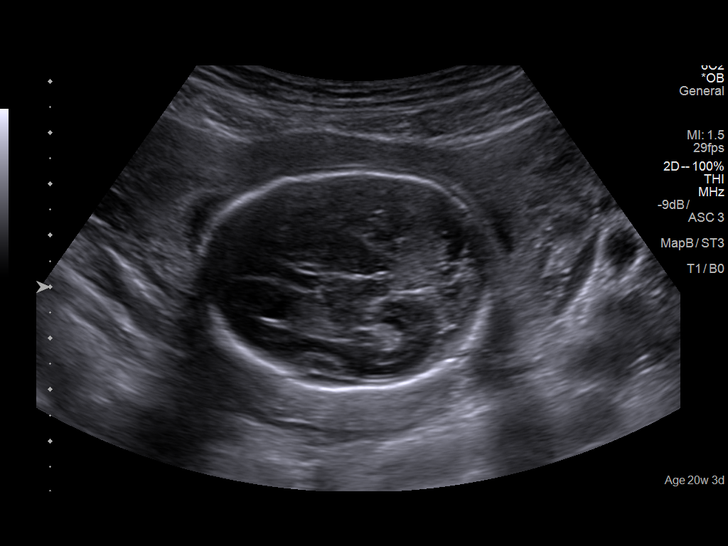
[im 39/96]
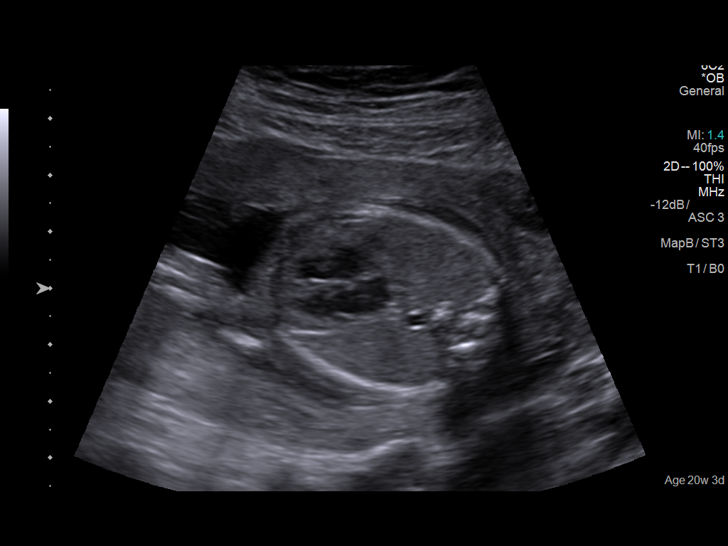
[im 46/96]
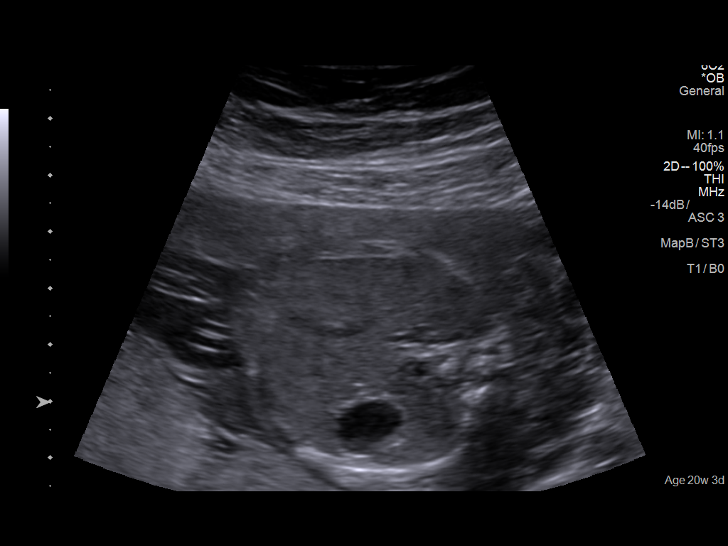
[im 53/96]
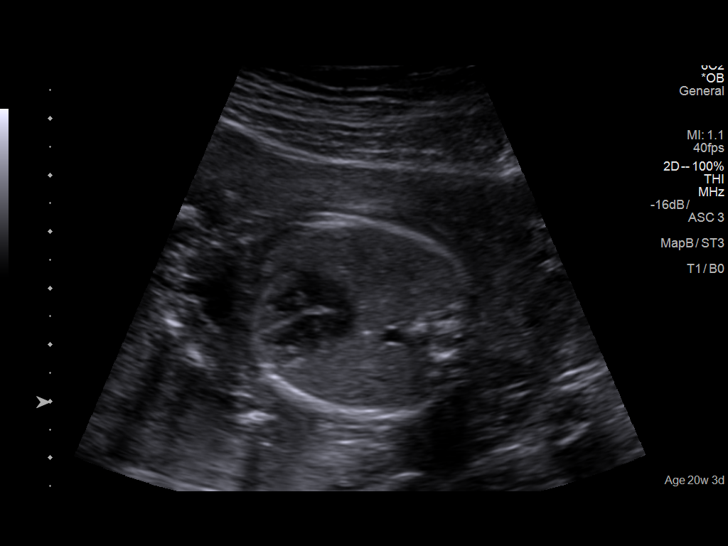
[im 60/96]
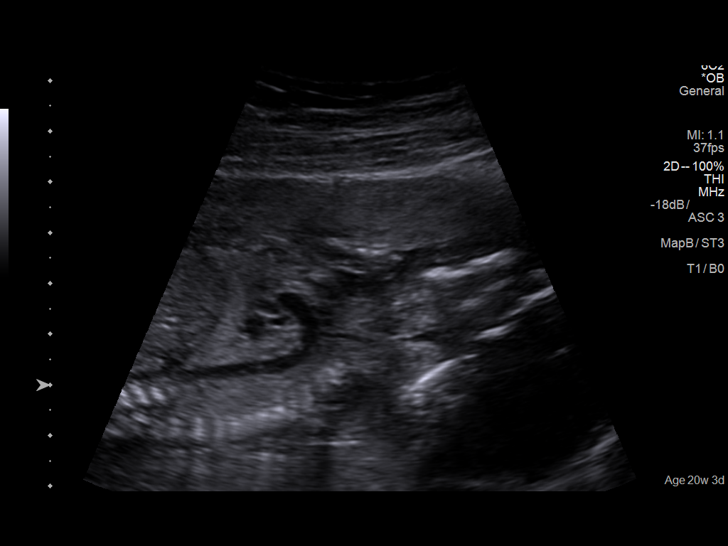
[im 67/96]
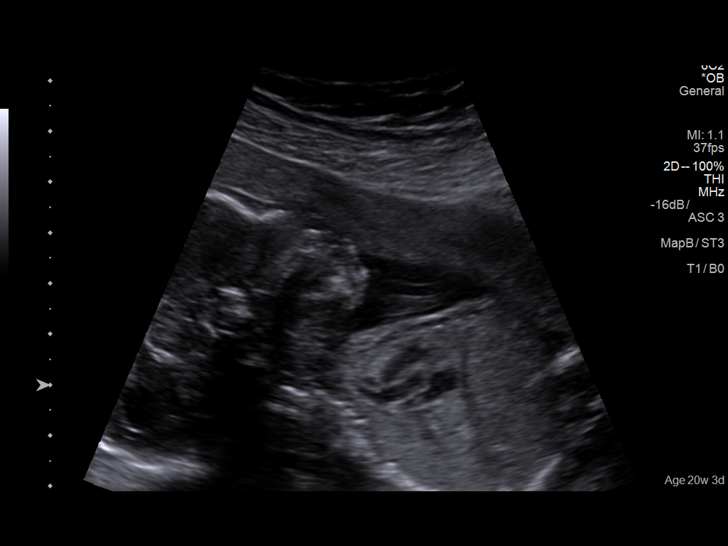
[im 74/96]
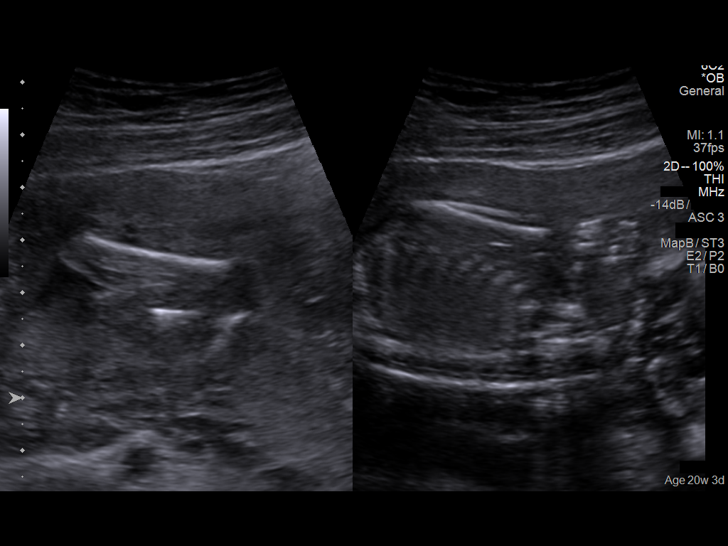
[im 81/96]
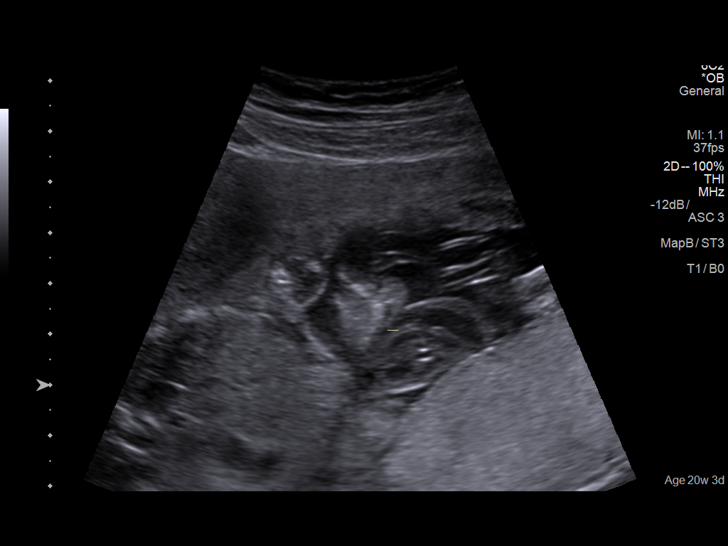
[im 88/96]
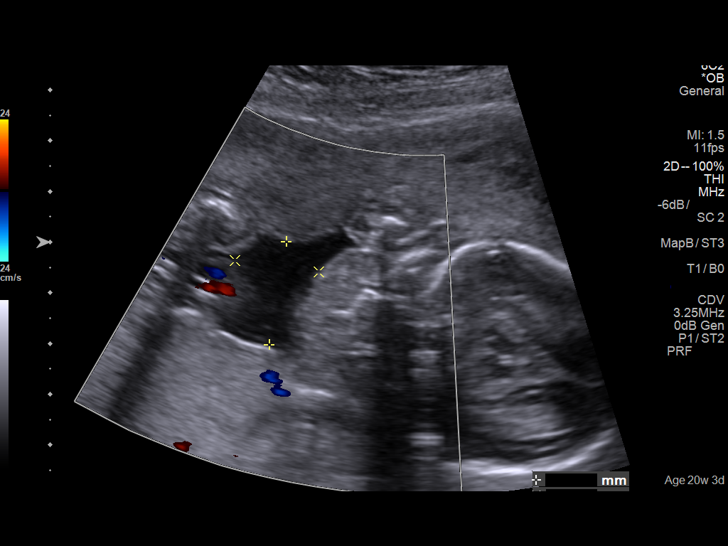
[im 96/96]
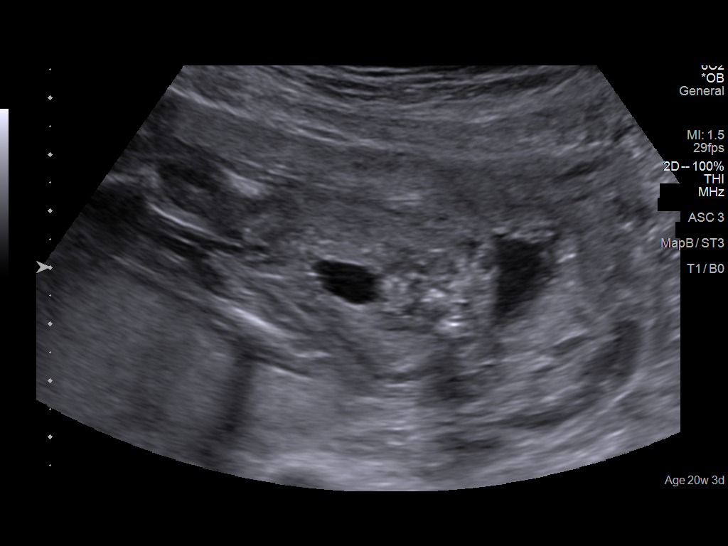

[14 of 28 positions shown; findings below may reference images not displayed]

Canned report from images found in remote index.

Refer to host system for actual result text.

## 2017-07-31 LAB — URINE CULTURE

## 2017-08-01 NOTE — Progress Notes (Signed)
ED CULTURE REPORT   32 yo female presented to the ED on 2/4 with complaints of emesis and diarrhea. During visit, urine culture was obtained and patient was discharged without antibiotics. On 2/8, urine culture resulted showing 40,000 colonies of citrobacter koseri and 70,000 E coli with both sensitive to cefazolin. Even though the patient was suspected to have viral gastroenteritis, ED MD Dr. Darnelle CatalanMalinda wanted to treat with antibiotics as UA showed some ketones and WBC. I called the patient and she reported that she was having some urinary symptoms. I told her that I would call in a prescription for cephalexin 500mg  Q12H for 5 days into her preferred pharmacy at [facility] on [address]   Results for orders placed or performed during the hospital encounter of 07/28/17  Urine Culture     Status: Abnormal   Collection Time: 07/28/17  6:25 PM  Result Value Ref Range Status   Specimen Description   Final    URINE, RANDOM Performed at [facility], [address] [place], [place] [address]    Special Requests   Final    NONE Performed at [facility], [address] [place], [place] [address]    Culture (A)  Final    70,000 COLONIES/mL ESCHERICHIA COLI 40,000 COLONIES/mL CITROBACTER KOSERI    Report Status 07/31/2017 FINAL  Final   Organism ID, Bacteria ESCHERICHIA COLI (A)  Final   Organism ID, Bacteria CITROBACTER KOSERI (A)  Final      Susceptibility   Citrobacter koseri - MIC*    CEFAZOLIN <=4 SENSITIVE Sensitive     CEFTRIAXONE <=1 SENSITIVE Sensitive     CIPROFLOXACIN <=0.25 SENSITIVE Sensitive     GENTAMICIN <=1 SENSITIVE Sensitive     IMIPENEM <=0.25 SENSITIVE Sensitive     NITROFURANTOIN 32 SENSITIVE Sensitive     TRIMETH/SULFA <=20 SENSITIVE Sensitive     PIP/TAZO <=4 SENSITIVE Sensitive     * 40,000 COLONIES/mL CITROBACTER KOSERI   Escherichia coli - MIC*    AMPICILLIN <=2 SENSITIVE Sensitive     CEFAZOLIN <=4 SENSITIVE Sensitive     CEFTRIAXONE  <=1 SENSITIVE Sensitive     CIPROFLOXACIN <=0.25 SENSITIVE Sensitive     GENTAMICIN <=1 SENSITIVE Sensitive     IMIPENEM <=0.25 SENSITIVE Sensitive     NITROFURANTOIN <=16 SENSITIVE Sensitive     TRIMETH/SULFA <=20 SENSITIVE Sensitive     AMPICILLIN/SULBACTAM <=2 SENSITIVE Sensitive     PIP/TAZO <=4 SENSITIVE Sensitive     Extended ESBL NEGATIVE Sensitive     * 70,000 COLONIES/mL ESCHERICHIA COLI   Yolanda BonineHannah Lifsey, PharmD Pharmacy Resident

## 2019-04-22 ENCOUNTER — Encounter: Payer: Self-pay | Admitting: Emergency Medicine

## 2019-04-22 ENCOUNTER — Emergency Department
Admission: EM | Admit: 2019-04-22 | Discharge: 2019-04-22 | Disposition: A | Discharge status: Home / Self Care | Payer: Self-pay | Attending: Emergency Medicine | Admitting: Emergency Medicine

## 2019-04-22 ENCOUNTER — Other Ambulatory Visit: Payer: Self-pay

## 2019-04-22 ENCOUNTER — Emergency Department: Payer: Self-pay

## 2019-04-22 DIAGNOSIS — J4520 Mild intermittent asthma, uncomplicated: Secondary | ICD-10-CM | POA: Insufficient documentation

## 2019-04-22 DIAGNOSIS — Z79899 Other long term (current) drug therapy: Secondary | ICD-10-CM | POA: Insufficient documentation

## 2019-04-22 DIAGNOSIS — J069 Acute upper respiratory infection, unspecified: Secondary | ICD-10-CM | POA: Insufficient documentation

## 2019-04-22 DIAGNOSIS — Z20828 Contact with and (suspected) exposure to other viral communicable diseases: Secondary | ICD-10-CM | POA: Insufficient documentation

## 2019-04-22 LAB — SARS CORONAVIRUS 2 BY RT PCR (HOSPITAL ORDER, PERFORMED IN ~~LOC~~ HOSPITAL LAB): SARS Coronavirus 2: NEGATIVE

## 2019-04-22 MED ORDER — IPRATROPIUM-ALBUTEROL 0.5-2.5 (3) MG/3ML IN SOLN
3.0000 mL | Freq: Once | RESPIRATORY_TRACT | Status: AC
  Administered 2019-04-22: 09:00:00 3 mL via RESPIRATORY_TRACT
  Filled 2019-04-22: qty 3

## 2019-04-22 MED ORDER — IPRATROPIUM-ALBUTEROL 0.5-2.5 (3) MG/3ML IN SOLN
3.0000 mL | Freq: Once | RESPIRATORY_TRACT | Status: AC
  Administered 2019-04-22: 3 mL via RESPIRATORY_TRACT
  Filled 2019-04-22: qty 3

## 2019-04-22 MED ORDER — PREDNISONE 10 MG PO TABS: ORAL_TABLET | ORAL | 0 refills | Status: AC

## 2019-04-22 MED ORDER — PREDNISONE 20 MG PO TABS
60.0000 mg | ORAL_TABLET | Freq: Once | ORAL | Status: AC
  Administered 2019-04-22: 60 mg via ORAL
  Filled 2019-04-22: qty 3

## 2019-04-22 MED ORDER — ALBUTEROL SULFATE HFA 108 (90 BASE) MCG/ACT IN AERS: 2.0000 | INHALATION_SPRAY | Freq: Four times a day (QID) | RESPIRATORY_TRACT | 0 refills | Status: AC | PRN

## 2019-04-22 NOTE — ED Triage Notes (Addendum)
Patient ambulatory to triage with steady gait, without difficulty or distress noted, mask in place; pt reports cough, wheezing, runny nose x 3 days; denies known fever, st "they told me I had to get checked before I could come back to work"

## 2019-04-22 NOTE — Discharge Instructions (Signed)
Follow-up with your primary care provider or establish a primary care provider with 1 the clinics listed on your discharge papers.  Increase fluids.  Take medication as directed.  The first dose of prednisone was given to you while in the ED.  Your next dose is starting tomorrow and then tapering down by 1 tablet each day until you are finished.  The albuterol inhaler is to be used 4 times a day as needed for wheezing.  If any severe worsening of your symptoms or shortness of breath return to the emergency department.

## 2019-04-22 NOTE — ED Notes (Signed)
See triage note  Presents with nasal congestion and sinus h/a   Also having some discomfort to chest with cough/inspiration  Denies any fever  States she has been taking OTC meds w/o relief

## 2019-04-22 NOTE — ED Provider Notes (Signed)
[facility] Emergency Department Provider Note   ____________________________________________   First MD Initiated Contact with Patient 04/22/19 [phone]     (approximate)  I have reviewed the triage vital signs and the nursing notes.   HISTORY  Chief Complaint Cough   HPI Theresa Alvarez is a 33 y.o. female presents to the ED with complaint of cough, congestion, rhinorrhea for the last 3 days.  Patient also reports wheezing and denies any history of asthma.  She states that her son does have asthma.  She is unaware of any fever.  Patient currently is sleeping in her car.  She denies any known exposure to Covid.  Patient is a non-smoker.     Past Medical History:  Diagnosis Date  . Complication of anesthesia   . History of pre-eclampsia     Patient Active Problem List   Diagnosis Date Noted  . Vaginal discharge during pregnancy in second trimester 06/22/2015  . Fetal cystic hygroma? 06/22/2015  . mild oligohydramnios  06/22/2015    Past Surgical History:  Procedure Laterality Date  . CHOLECYSTECTOMY  2014   lap    Prior to Admission medications   Medication Sig Start Date End Date Taking? Authorizing Provider  albuterol (VENTOLIN HFA) 108 (90 Base) MCG/ACT inhaler Inhale 2 puffs into the lungs every 6 (six) hours as needed for wheezing or shortness of breath. 04/22/19   Tommi RumpsSummers, Rhonda L, PA-C  predniSONE (DELTASONE) 10 MG tablet Take 5 tablets tomorrow, on day 2 take 4 tablets, day 3 take 3 tablets, day 4 take 2 tablets, day 5 take 1 tablets 04/22/19   Tommi RumpsSummers, Rhonda L, PA-C  prenatal vitamin w/FE, FA (NATACHEW) 29-1 MG CHEW chewable tablet Chew 1 tablet by mouth daily at 12 noon.    [provider]    Allergies Shrimp [shellfish allergy]  Family History  Problem Relation Age of Onset  . Diabetes Maternal Grandmother   . Diabetes Father   . Diabetes Paternal Grandmother     Social History Social History   Tobacco Use  .  Smoking status: Never Smoker  . Smokeless tobacco: Never Used  Substance Use Topics  . Alcohol use: No  . Drug use: Yes    Types: Marijuana    Comment: MJ use one week ago    Review of Systems Constitutional: No fever/chills Cardiovascular: Denies chest pain. Respiratory: Positive shortness of breath.  Positive for cough. Gastrointestinal: No abdominal pain.  No nausea, no vomiting.  Genitourinary: Negative for dysuria. Musculoskeletal: Negative for back pain. Skin: Negative for rash. Neurological: Negative for headaches, focal weakness or numbness. ____________________________________________   PHYSICAL EXAM:  VITAL SIGNS: ED Triage Vitals  Enc Vitals Group     BP 04/22/19 0635 132/80     Pulse Rate 04/22/19 0635 (!) 103     Resp 04/22/19 0635 20     Temp 04/22/19 0635 98.1 F (36.7 C)     Temp Source 04/22/19 0635 Oral     SpO2 04/22/19 0635 98 %     Weight 04/22/19 0621 148 lb (67.1 kg)     Height 04/22/19 0621 5\' 1"  (1.549 m)     Head Circumference --      Peak Flow --      Pain Score 04/22/19 0621 0     Pain Loc --      Pain Edu? --      Excl. in GC? --    Constitutional: Alert and oriented. Well appearing and in  no acute distress. Eyes: Conjunctivae are normal.  Head: Atraumatic. Nose: No congestion/rhinnorhea. Mouth/Throat: Mucous membranes are moist.  Oropharynx non-erythematous. Neck: No stridor.   Cardiovascular: Normal rate, regular rhythm. Grossly normal heart sounds.  Good peripheral circulation. Respiratory: Normal respiratory effort.  No retractions. Lungs moderate bilateral expiratory wheezes heard throughout. Gastrointestinal: Soft and nontender. No distention. No abdominal bruits. No CVA tenderness. Musculoskeletal: No lower extremity tenderness nor edema.  No joint effusions. Neurologic:  Normal speech and language. No gross focal neurologic deficits are appreciated. No gait instability. Skin:  Skin is warm, dry and intact. No rash noted.  Psychiatric: Mood and affect are normal. Speech and behavior are normal.  ____________________________________________   LABS (all labs ordered are listed, but only abnormal results are displayed)  Labs Reviewed  SARS CORONAVIRUS 2 BY RT PCR (HOSPITAL ORDER, [place])    RADIOLOGY   Official radiology report(s): Dg Chest Portable 1 View  Result Date: 04/22/2019 CLINICAL DATA:  Cough and wheezing with shortness of breath EXAM: PORTABLE CHEST 1 VIEW COMPARISON:  07/28/2017 FINDINGS: Normal heart size and mediastinal contours. No acute infiltrate or edema. No effusion or pneumothorax. No acute osseous findings. Cholecystectomy clips IMPRESSION: Negative chest Electronically Signed   By: Monte Fantasia M.D.   On: 04/22/2019 07:59    ____________________________________________   PROCEDURES  Procedure(s) performed (including Critical Care):  Procedures   ____________________________________________   INITIAL IMPRESSION / ASSESSMENT AND PLAN / ED COURSE  As part of my medical decision making, I reviewed the following data within the electronic MEDICAL RECORD NUMBER Notes from prior ED visits and [place] Controlled Substance Database  33 year old female presents to the ED with complaint of cough, wheezing, rhinorrhea for the last 3 days.  She is unaware of any fever but has been sleeping in her car.  She continues to work at [employer].  She states that she was at work today with the symptoms and was told she would need to be checked out before she could come back to work.  Patient is aware that she is wheezing.  She denies any known Covid exposure.  She denies any change in taste or smell.  Chest x-ray was negative for acute changes.  Covid test was done while in the ED in the event that patient went to a homeless shelter.  Covid test was negative.  Patient was given DuoNeb treatments with moderate amount of improvement along with prednisone 60 mg p.o.  Patient  admits that she has been using her son's inhaler from time to time in the past.  Patient was discharged improved.  She was given a prescription for prednisone to continue taking and a albuterol inhaler.  Patient was made aware to return to the emergency department if any severe worsening of her symptoms or urgent concerns.  ____________________________________________   FINAL CLINICAL IMPRESSION(S) / ED DIAGNOSES  Final diagnoses:  Acute upper respiratory infection  Mild intermittent asthma in adult without complication     ED Discharge Orders         Ordered    predniSONE (DELTASONE) 10 MG tablet     04/22/19 1016    albuterol (VENTOLIN HFA) 108 (90 Base) MCG/ACT inhaler  Every 6 hours PRN     04/22/19 1016           Note:  This document was prepared using Dragon voice recognition software and may include unintentional dictation errors.    Johnn Hai, PA-C 04/22/19 1625  Sharyn Creamer, MD 05/03/19 2139

## 2019-06-15 IMAGING — CR DG CHEST 2V
1 series · 2 of 2 positions shown · non-contrast
Comparison: None.

CLINICAL DATA: Cough for 2 weeks

EXAM:
CHEST  2 VIEW

[Series 1: dg chest 2 view · 0.14mm/px · 2 of 2 slices shown]
[im 1/2]
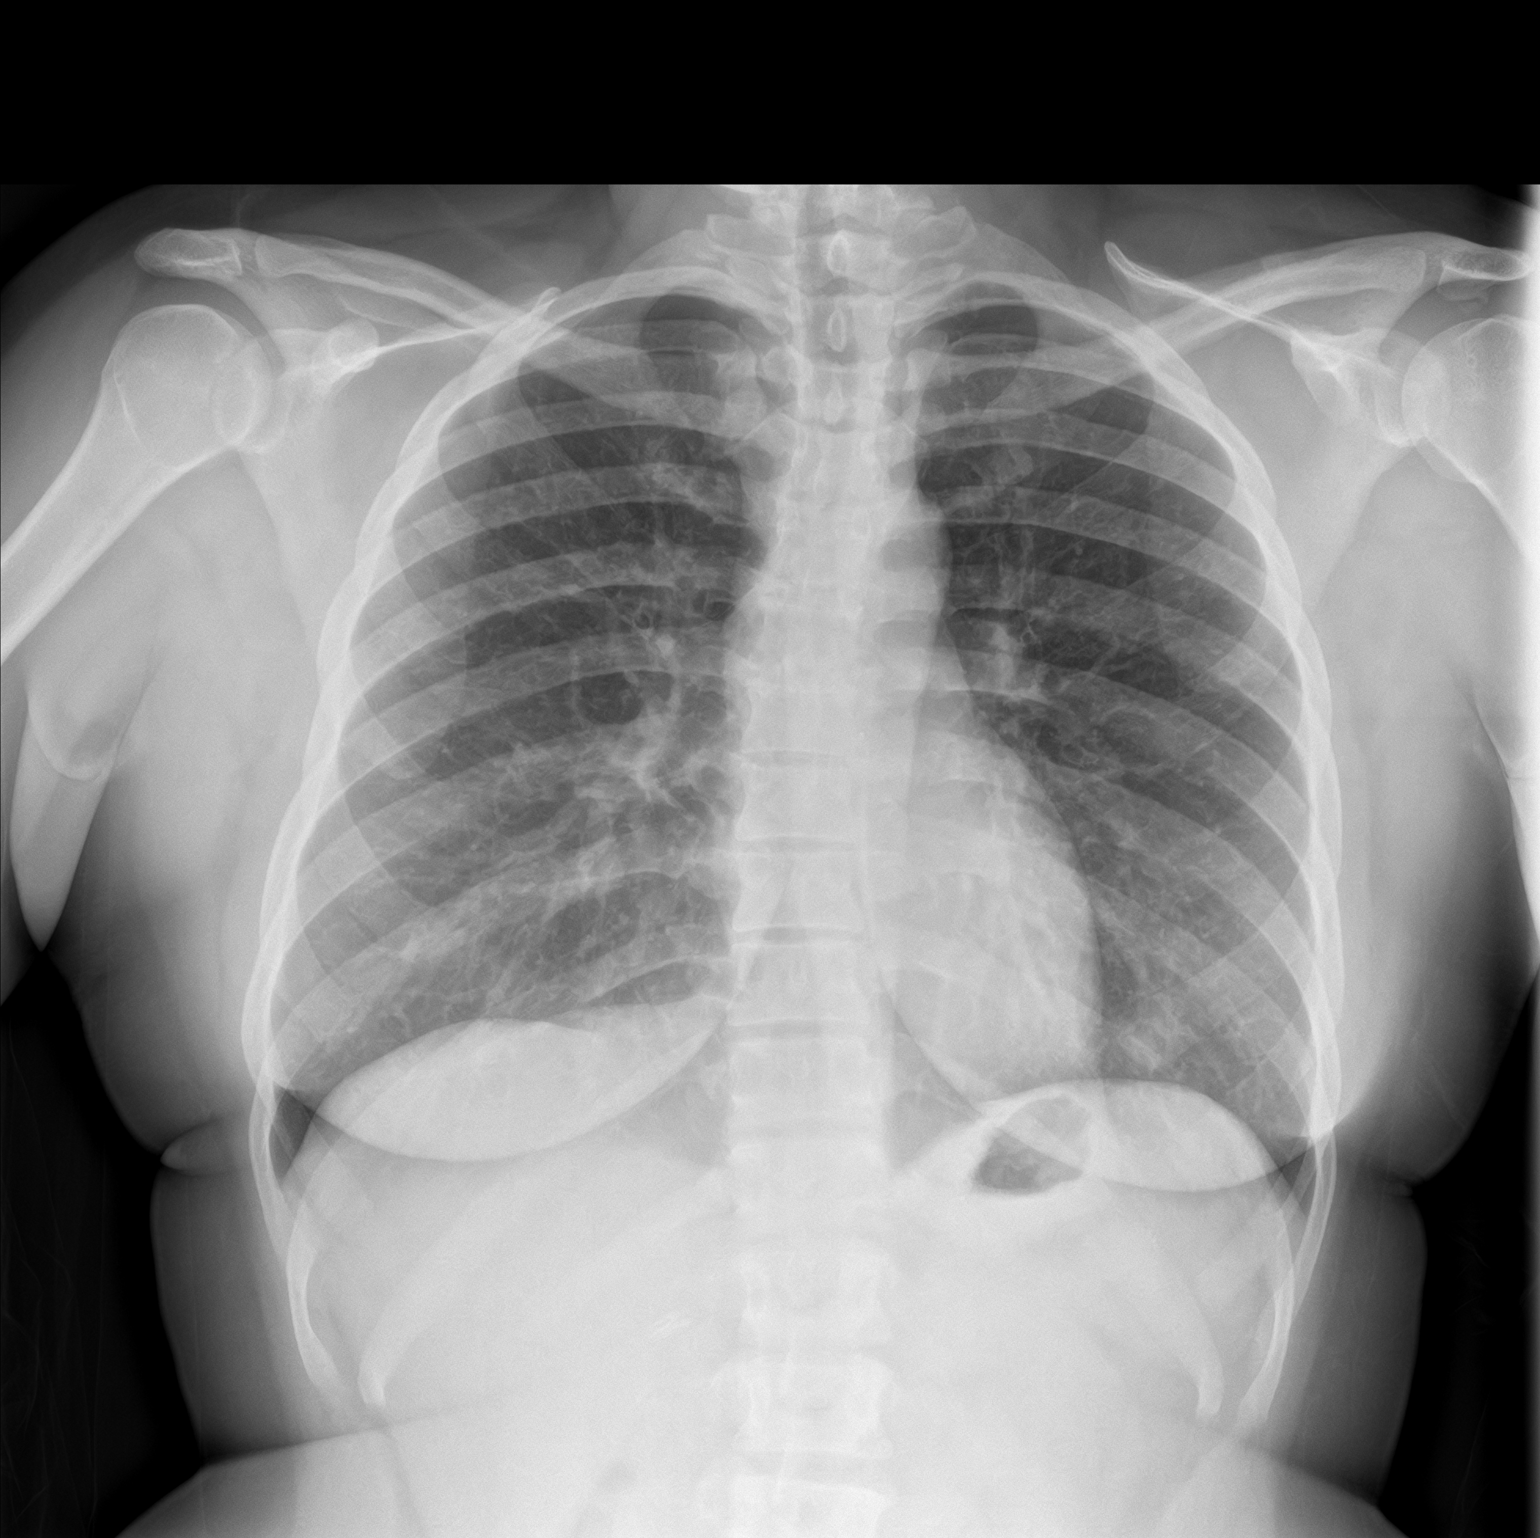
[im 2/2]
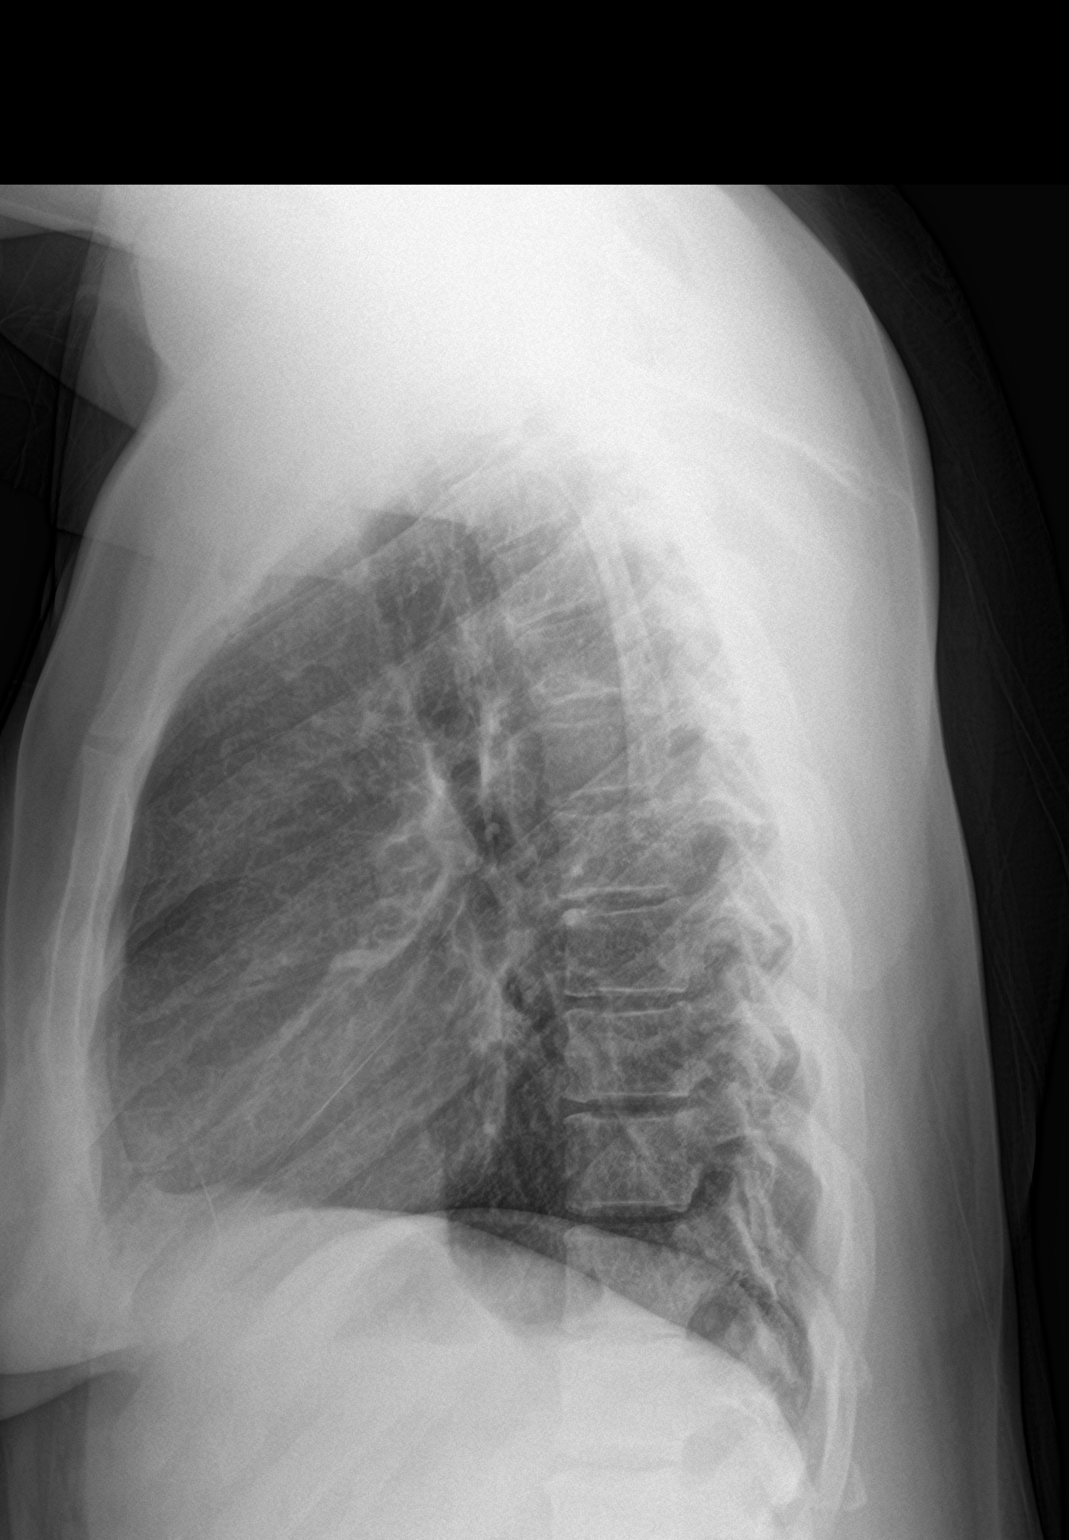

[2 of 2 positions shown; findings below may reference images not displayed]

FINDINGS: The heart size and mediastinal contours are within normal limits.
Both lungs are clear. The visualized skeletal structures are
unremarkable.
IMPRESSION: No active cardiopulmonary disease.

## 2021-03-09 IMAGING — DX DG CHEST 1V PORT
1 series · 1 of 1 positions shown · non-contrast
Comparison: 07/28/2017

CLINICAL DATA: Cough and wheezing with shortness of breath

EXAM:
PORTABLE CHEST 1 VIEW

[chest ap]
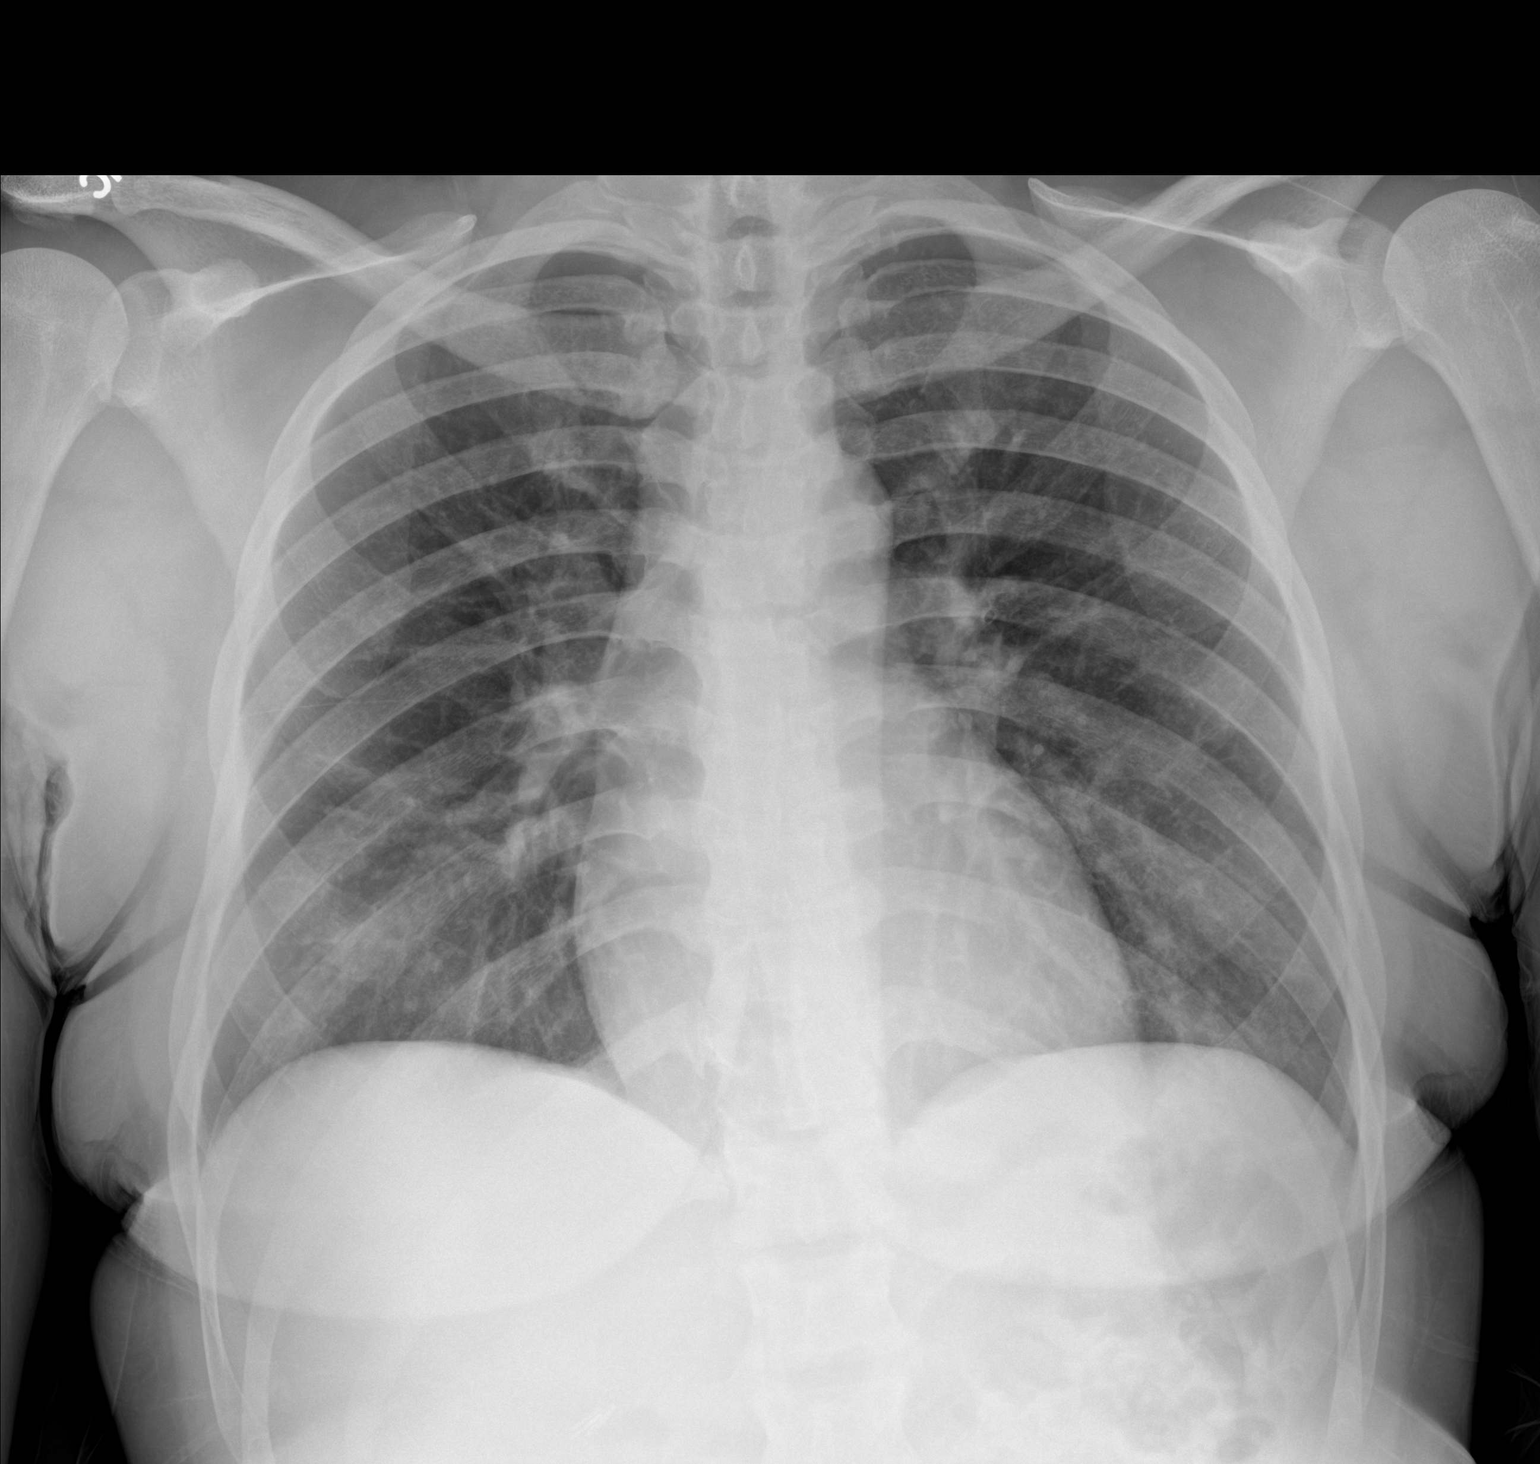

[1 of 1 positions shown; findings below may reference images not displayed]

FINDINGS: Normal heart size and mediastinal contours. No acute infiltrate or
edema. No effusion or pneumothorax. No acute osseous findings.
Cholecystectomy clips
IMPRESSION: Negative chest

## 2023-02-11 ENCOUNTER — Ambulatory Visit: Payer: Self-pay | Admitting: Podiatry
